# Patient Record
Sex: Female | Born: 1975
Health system: Southern US, Community
[De-identification: ages and names within clinical notes are randomized; demographics above are authoritative.]

## PROBLEM LIST (undated history)

## (undated) DIAGNOSIS — G35 Multiple sclerosis: Secondary | ICD-10-CM

## (undated) DIAGNOSIS — G709 Myoneural disorder, unspecified: Secondary | ICD-10-CM

## (undated) HISTORY — PX: TUBAL LIGATION: SHX77

## (undated) HISTORY — DX: Multiple sclerosis: G35

---

## 2005-01-22 ENCOUNTER — Emergency Department (HOSPITAL_COMMUNITY): Admission: EM | Admit: 2005-01-22 | Discharge: 2005-01-22 | Payer: Self-pay | Admitting: Family Medicine

## 2005-02-26 ENCOUNTER — Emergency Department (HOSPITAL_COMMUNITY): Admission: EM | Admit: 2005-02-26 | Discharge: 2005-02-26 | Payer: Self-pay | Admitting: Emergency Medicine

## 2005-03-22 ENCOUNTER — Emergency Department (HOSPITAL_COMMUNITY): Admission: EM | Admit: 2005-03-22 | Discharge: 2005-03-22 | Payer: Self-pay | Admitting: Emergency Medicine

## 2005-04-15 ENCOUNTER — Emergency Department (HOSPITAL_COMMUNITY): Admission: EM | Admit: 2005-04-15 | Discharge: 2005-04-15 | Payer: Self-pay | Admitting: Emergency Medicine

## 2006-05-05 ENCOUNTER — Ambulatory Visit (HOSPITAL_COMMUNITY): Admission: RE | Admit: 2006-05-05 | Discharge: 2006-05-05 | Payer: Self-pay | Admitting: Obstetrics & Gynecology

## 2006-09-01 ENCOUNTER — Inpatient Hospital Stay (HOSPITAL_COMMUNITY): Admission: AD | Admit: 2006-09-01 | Discharge: 2006-09-01 | Payer: Self-pay | Admitting: Family Medicine

## 2006-09-01 ENCOUNTER — Ambulatory Visit: Payer: Self-pay | Admitting: Obstetrics and Gynecology

## 2006-10-01 ENCOUNTER — Encounter (INDEPENDENT_AMBULATORY_CARE_PROVIDER_SITE_OTHER): Payer: Self-pay | Admitting: Gynecology

## 2006-10-01 ENCOUNTER — Inpatient Hospital Stay (HOSPITAL_COMMUNITY): Admission: AD | Admit: 2006-10-01 | Discharge: 2006-10-03 | Payer: Self-pay | Admitting: Family Medicine

## 2006-10-01 ENCOUNTER — Ambulatory Visit: Payer: Self-pay | Admitting: Gynecology

## 2008-05-31 ENCOUNTER — Emergency Department (HOSPITAL_COMMUNITY): Admission: EM | Admit: 2008-05-31 | Discharge: 2008-05-31 | Payer: Self-pay | Admitting: Emergency Medicine

## 2008-10-30 ENCOUNTER — Emergency Department (HOSPITAL_COMMUNITY): Admission: EM | Admit: 2008-10-30 | Discharge: 2008-10-30 | Payer: Self-pay | Admitting: Emergency Medicine

## 2010-08-03 NOTE — Op Note (Signed)
Jeanette Estrada, DATE              ACCOUNT NO.:  [id number]   MEDICAL RECORD NO.:  [id number]          PATIENT TYPE:  INP   LOCATION:  9101                          FACILITY:  [facility]   PHYSICIAN:  Ginger Carne, MD  DATE OF BIRTH:  1975-12-29   DATE OF PROCEDURE:  10/01/2006  DATE OF DISCHARGE:                               OPERATIVE REPORT   PREOPERATIVE DIAGNOSIS:  Postpartum sterilization.   POSTOPERATIVE DIAGNOSIS:  Postpartum sterilization.   PROCEDURE:  [place] bilateral tubal ligation.   SURGEON:  Ginger Carne, M.D.   ASSISTANT:  None.   COMPLICATIONS:  None immediate.   ESTIMATED BLOOD LOSS:  Minimal.   SPECIMEN:  Portions of right and left tubes to pathology.   COMPLICATIONS:  None immediate.   OPERATIVE FINDINGS:  Both tubes were identified from their isthmus to  their fimbriated ends separate apart from their respective round  ligaments. Uterus, tubes and ovaries showed normal decidual changes of  pregnancy.   OPERATIVE PROCEDURE:  The patient prepped and draped in usual fashion  and placed in lithotomy position.  Betadine solution used for antiseptic  and the patient was catheterized prior to procedure.  After adequate  epidural top off, a small vertical infraumbilical incision was made.  The abdomen opened.  Both tubes were grasped with the isthmus ampullary  junction.  Again they were identified separate apart from their  respective round ligaments. 3 cm of tube were incorporated and affixed 2-  0 plain catgut sutures utilized twice. Above the knots tubes were cut,  sent separately to pathology.  The tips were cauterized and no active  bleeding noted from either side. Afterwards of closure of the fascia in  one layer with 0 Vicryl running suture and 2-0 Monocryl for subcuticular  closure.  Instrument and sponge count were correct.  The patient  tolerated the procedure well, returned post anesthesia recovery room in  excellent condition.      Ginger Carne, MD  Electronically Signed     SHB/MEDQ  D:  10/01/2006  T:  10/01/2006  Job:  [id number]

## 2010-09-25 IMAGING — CR DG WRIST COMPLETE 3+V*R*
6 series · 6 of 6 positions shown · non-contrast
Comparison: None

CLINICAL DATA: Fall.

RIGHT WRIST - COMPLETE 3+ VIEW

[x wrist pa left]
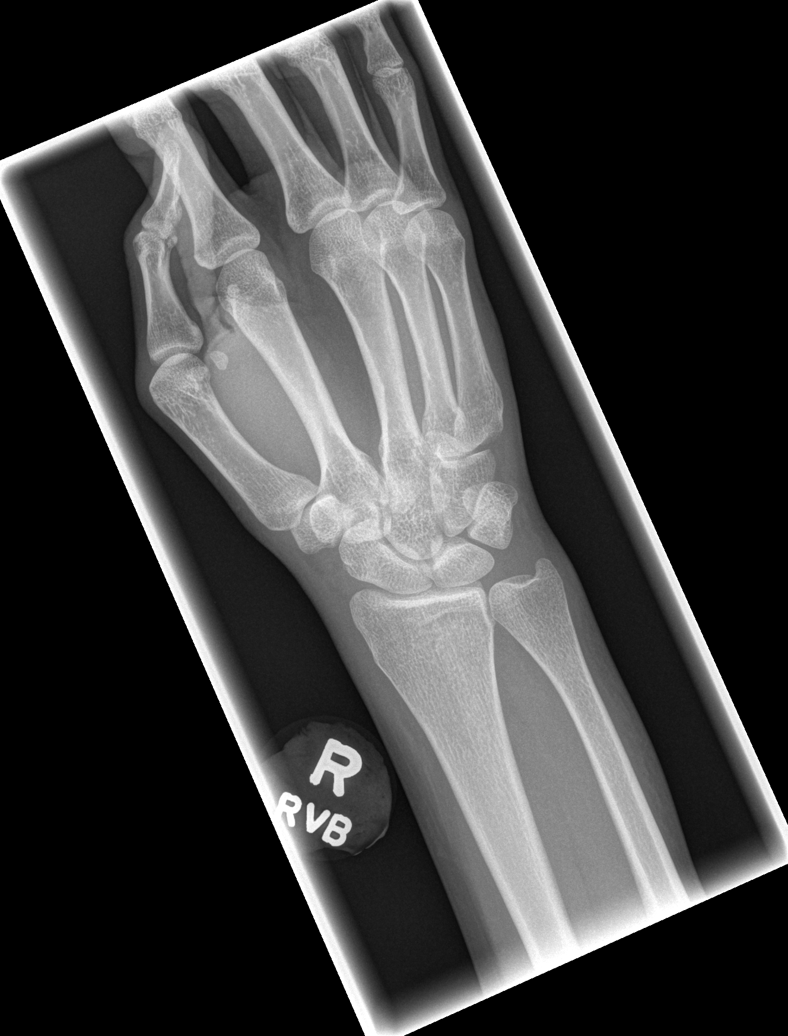

[x wrist obl left]
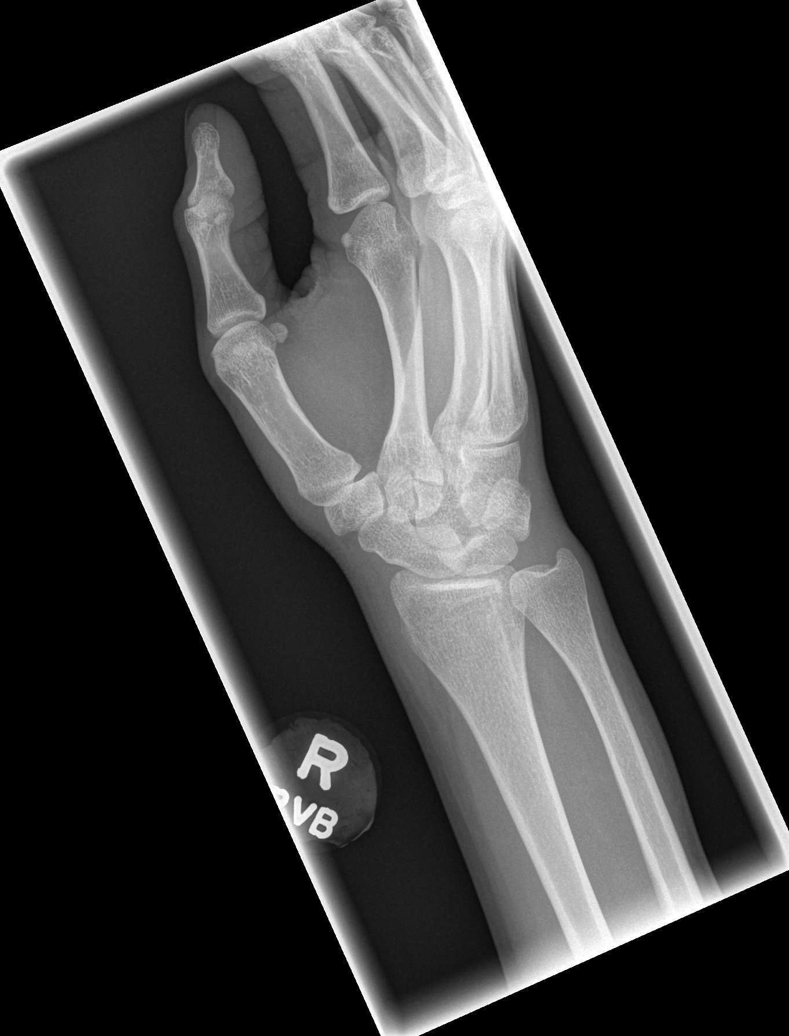

[x wrist lat left]
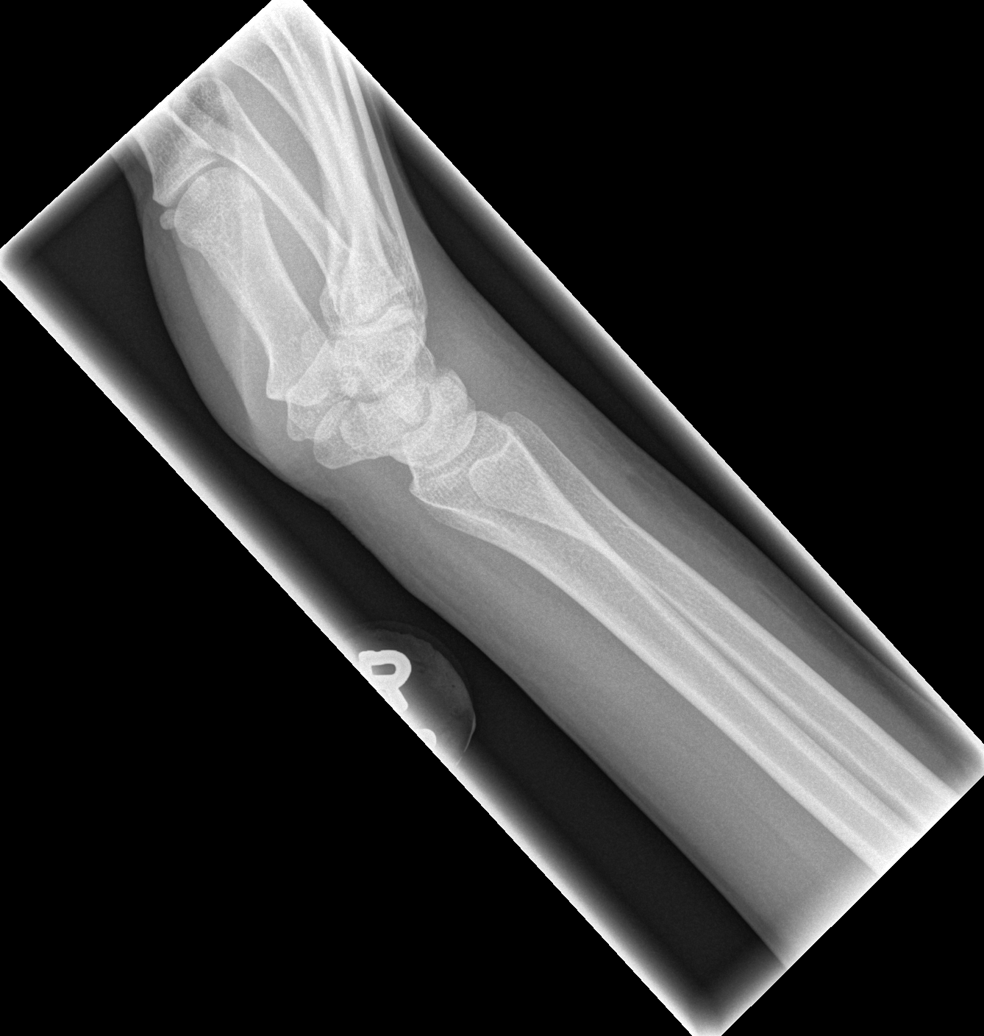

[x wrist navicular (1 of 3)]
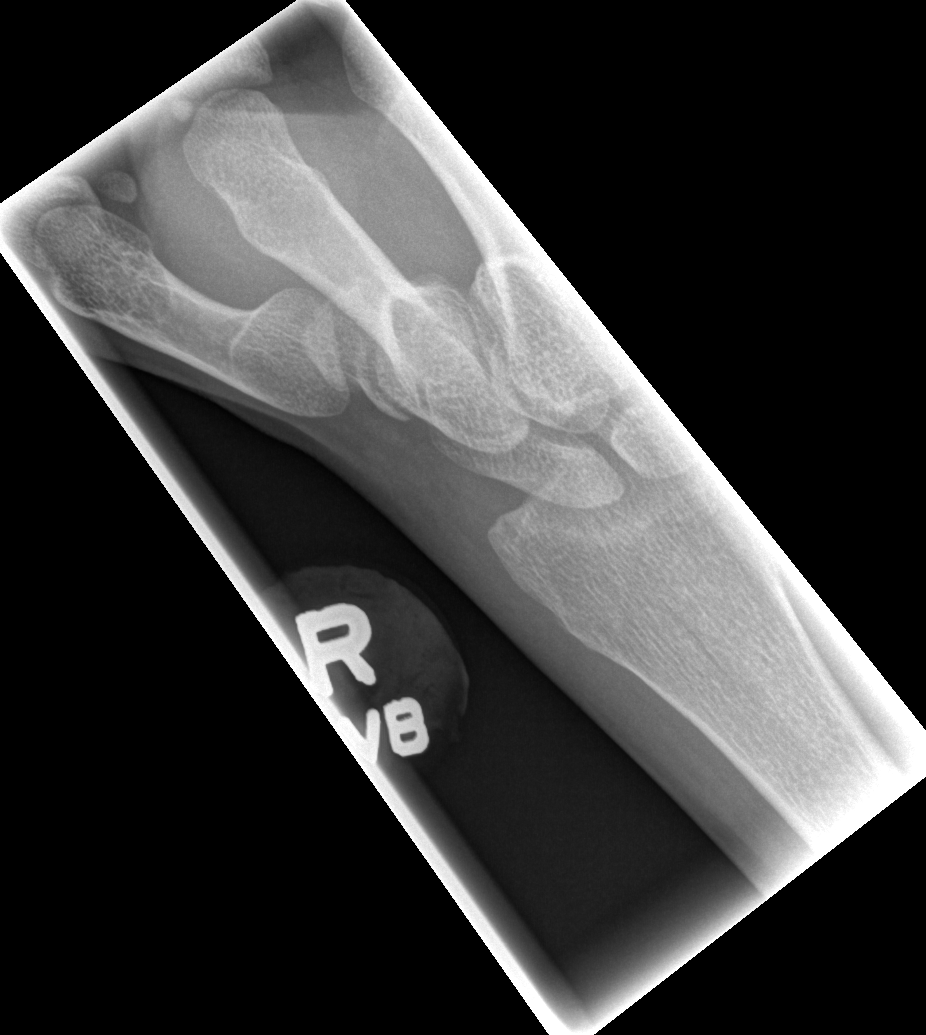

[x wrist navicular (2 of 3)]
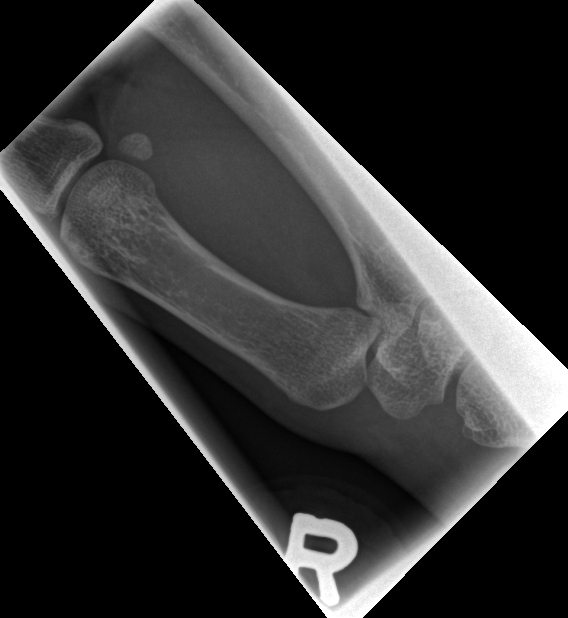

[x wrist navicular (3 of 3)]
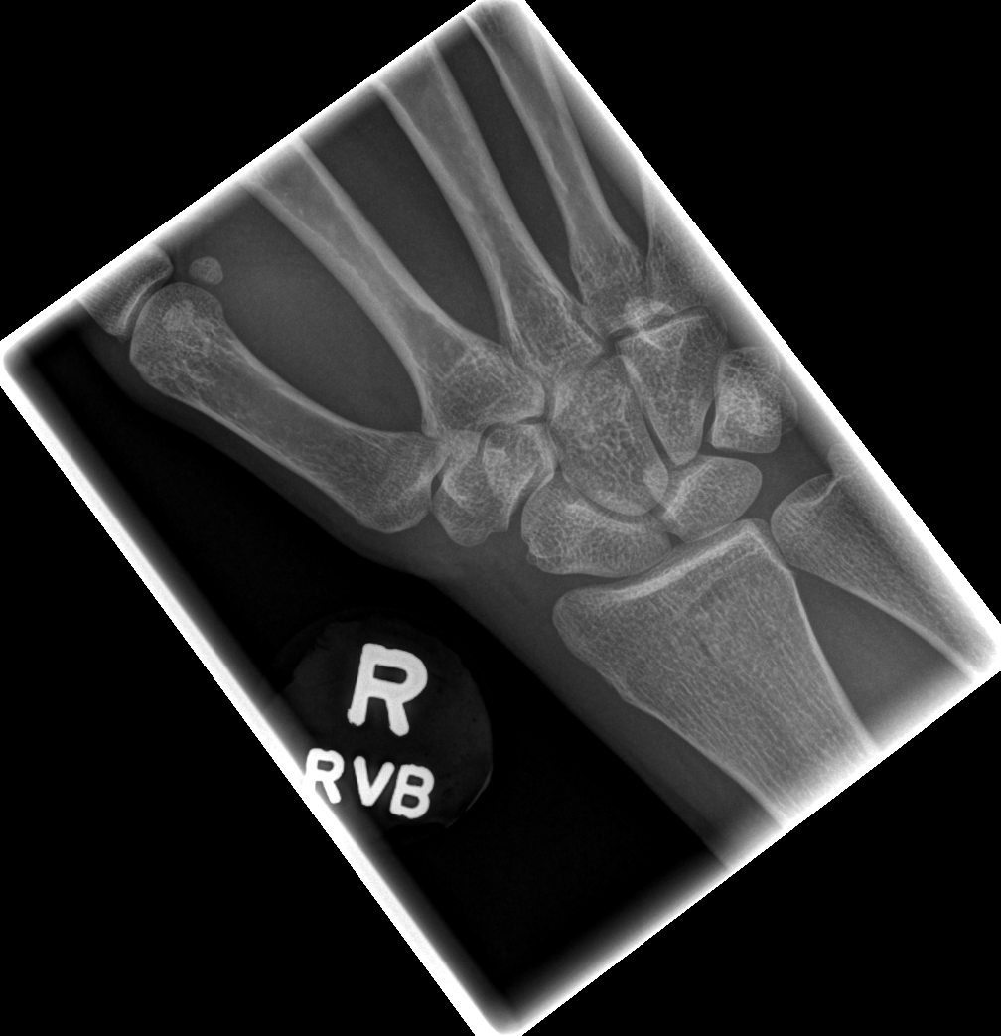

[6 of 6 positions shown; findings below may reference images not displayed]

FINDINGS: No acute fracture.  No dislocation.  Unremarkable soft
tissues.
IMPRESSION: No acute bony injury.

## 2010-12-31 LAB — RH IMMUNE GLOB WKUP(>/=20WKS)(NOT WOMEN'S HOSP): Fetal Screen: NEGATIVE

## 2010-12-31 LAB — CBC
HCT: 38.6
HCT: 39.4
Hemoglobin: 12.8
Hemoglobin: 13
MCHC: 33
MCHC: 33.2
MCHC: 33.2
MCV: 92
MCV: 92.6
RBC: 4.11
RBC: 4.19
WBC: 13.8 — ABNORMAL HIGH
WBC: 14.8 — ABNORMAL HIGH

## 2010-12-31 LAB — RPR: RPR Ser Ql: NONREACTIVE

## 2011-01-25 ENCOUNTER — Encounter: Payer: Self-pay | Admitting: *Deleted

## 2011-01-25 ENCOUNTER — Emergency Department (HOSPITAL_COMMUNITY): Payer: Self-pay

## 2011-01-25 ENCOUNTER — Emergency Department (HOSPITAL_COMMUNITY)
Admission: EM | Admit: 2011-01-25 | Discharge: 2011-01-26 | Disposition: A | Discharge status: Home / Self Care | Payer: Self-pay | Attending: Emergency Medicine | Admitting: Emergency Medicine

## 2011-01-25 DIAGNOSIS — R51 Headache: Secondary | ICD-10-CM | POA: Insufficient documentation

## 2011-01-25 DIAGNOSIS — R5381 Other malaise: Secondary | ICD-10-CM | POA: Insufficient documentation

## 2011-01-25 DIAGNOSIS — R32 Unspecified urinary incontinence: Secondary | ICD-10-CM | POA: Insufficient documentation

## 2011-01-25 DIAGNOSIS — R531 Weakness: Secondary | ICD-10-CM

## 2011-01-25 DIAGNOSIS — W19XXXA Unspecified fall, initial encounter: Secondary | ICD-10-CM | POA: Insufficient documentation

## 2011-01-25 DIAGNOSIS — R279 Unspecified lack of coordination: Secondary | ICD-10-CM | POA: Insufficient documentation

## 2011-01-25 DIAGNOSIS — R5383 Other fatigue: Secondary | ICD-10-CM | POA: Insufficient documentation

## 2011-01-25 DIAGNOSIS — R42 Dizziness and giddiness: Secondary | ICD-10-CM | POA: Insufficient documentation

## 2011-01-25 LAB — URINALYSIS, ROUTINE W REFLEX MICROSCOPIC
Glucose, UA: NEGATIVE mg/dL
Ketones, ur: NEGATIVE mg/dL
Nitrite: NEGATIVE
Protein, ur: 30 mg/dL — AB
Specific Gravity, Urine: 1.023 (ref 1.005–1.030)
Urobilinogen, UA: 1 mg/dL (ref 0.0–1.0)
pH: 7.5 (ref 5.0–8.0)

## 2011-01-25 LAB — RAPID URINE DRUG SCREEN, HOSP PERFORMED
Benzodiazepines: NOT DETECTED
Opiates: NOT DETECTED

## 2011-01-25 LAB — URINE MICROSCOPIC-ADD ON

## 2011-01-25 LAB — PREGNANCY, URINE: Preg Test, Ur: NEGATIVE

## 2011-01-25 NOTE — ED Notes (Signed)
Pt can not obtain urine at this time.....she wet the bed.Jeanette KitchenMarland KitchenMarland KitchenMarland Kitcheni told pt to let me or the  nurse know when she has to go to bathroom.

## 2011-01-25 NOTE — ED Notes (Signed)
Assumed care of patient, introduced self to patient. Patient states that she "wet the bed"  Patient states that she has been having problems holding her urine and is incontinent often.  Linens changed and new gown provided.  Patient aware still of need for urine sample.  Patient reports that she called the EMS for dizziness which has been going on for months, states that she is unsteady on her feet and very weak, also with numbness and tingling to arms and legs.  Patient reports no diagnosed medical conditions, no allergies, no prescribed medications but takes vitamins and OTC pain medication.  Patient reports headache at present as well.

## 2011-01-25 NOTE — ED Provider Notes (Signed)
History     CSN: [id number] Arrival date & time: 01/25/2011  7:46 PM   First MD Initiated Contact with Patient 01/25/11 2023      Chief Complaint  Patient presents with  . Weakness    (Consider location/radiation/quality/duration/timing/severity/associated sxs/prior treatment) Patient is a 35 y.o. female presenting with weakness. The history is provided by the patient.  Weakness The primary symptoms include headaches, dizziness and focal weakness. Primary symptoms do not include syncope, loss of consciousness, visual change, paresthesias or fever. Primary symptoms comment: Weaknesses periodic and causes falls. Episode onset: She has had similar symptoms for at least 12 years. The symptoms are worsening. The neurological symptoms are focal (Left side of her body gets weaker).  Headache is a chronic problem. The headache is associated with weakness and loss of balance. The headache is not associated with visual change or paresthesias.  Dizziness also occurs with weakness.  Additional symptoms include weakness and loss of balance. Additional symptoms do not include pain, lower back pain or leg pain. Associated symptoms comments: She has had urinary incontinence. Medical issues do not include seizures or cerebral vascular accident. Workup history includes MRI. Procedure history comments: She was told she might have MS.    History reviewed. No pertinent past medical history.  History reviewed. No pertinent past surgical history.  No family history on file.  History  Substance Use Topics  . Smoking status: Never Smoker   . Smokeless tobacco: Not on file  . Alcohol Use:     OB History    Grav Para Term Preterm Abortions TAB SAB Ect Mult Living                  Review of Systems  Constitutional: Negative for fever.  Cardiovascular: Negative for syncope.  Genitourinary:       Urinary incontinence twice a week, associated with urgency  Neurological: Positive for dizziness, focal  weakness, weakness, headaches and loss of balance. Negative for loss of consciousness and paresthesias.  All other systems reviewed and are negative.    Allergies  Review of patient's allergies indicates no known allergies.  Home Medications   Current Outpatient Rx  Name Route Sig Dispense Refill  . VITAMIN B-12 PO Oral Take 1 tablet by mouth daily.      . OMEGA-3 FATTY ACIDS 1000 MG PO CAPS Oral Take 1 g by mouth daily.      [name] MUCINEX PO Oral Take 1 tablet by mouth daily as needed. For congestion     . IBUPROFEN 200 MG PO TABS Oral Take 400 mg by mouth daily as needed. For pain     . OVER THE COUNTER MEDICATION Oral Take 2 capsules by mouth daily as needed. OTC tension headache medicine, take as needed for migraine     . HYDROCODONE-ACETAMINOPHEN 5-325 MG PO TABS Oral Take 2 tablets by mouth every 4 (four) hours as needed for pain. 20 tablet 0    BP 91/63  Pulse 71  Temp(Src) 99.1 F (37.3 C) (Oral)  Resp 20  SpO2 96%  Physical Exam  Constitutional: She is oriented to person, place, and time. She appears well-developed and well-nourished.  HENT:  Head: Normocephalic and atraumatic.  Eyes: Conjunctivae and EOM are normal. Pupils are equal, round, and reactive to light.  Neck: Normal range of motion. Neck supple.  Cardiovascular: Normal rate, regular rhythm and normal heart sounds.   Pulmonary/Chest: Effort normal and breath sounds normal.  Abdominal: Soft. Bowel sounds are normal.  Musculoskeletal:  Normal range of motion. She exhibits no edema and no tenderness.  Neurological: She is alert and oriented to person, place, and time. No cranial nerve deficit. She exhibits normal muscle tone. Coordination normal.  Skin: Skin is warm and dry.  Psychiatric: She has a normal mood and affect. Her behavior is normal. Judgment and thought content normal.    ED Course  Procedures (including critical care time) OTHER DATA REVIEWED: Nursing notes, vital signs, and past medical records  reviewed.    DIAGNOSTIC STUDIES: Oxygen Saturation is 96% on room air, normal by my interpretation.    LABS / RADIOLOGY: Possible urinary tract infection, culture ordered.  PROCEDURES:    ED COURSE / COORDINATION OF CARE:   MDM: Evaluation most consistent with indolent multiple sclerosis. This likely explains recurrent falls and urinary incontinence. There is no apparent urinary tract infection. At this time. Patient is stable for discharge with outpatient evaluation and ultimate diagnosis to be made by neurology.  IMPRESSION:  PLAN:  Home  The patient is to return the emergency department if there is any worsening of symptoms. I have reviewed the discharge instructions with the patient and husband  CONDITION ON DISCHARGE: Good  MEDICATIONS GIVEN IN THE E.D.  Medications  Cyanocobalamin (VITAMIN B-12 PO) (not administered)  fish oil-omega-3 fatty acids 1000 MG capsule (not administered)  OVER THE COUNTER MEDICATION (not administered)  ibuprofen (ADVIL,MOTRIN) 200 MG tablet (not administered)  GuaiFENesin (MUCINEX PO) (not administered)  HYDROcodone-acetaminophen (NORCO) 5-325 MG per tablet (not administered)    DISCHARGE MEDICATIONS: New Prescriptions   HYDROCODONE-ACETAMINOPHEN (NORCO) 5-325 MG PER TABLET    Take 2 tablets by mouth every 4 (four) hours as needed for pain.      Labs Reviewed  URINALYSIS, ROUTINE W REFLEX MICROSCOPIC - Abnormal; Notable for the following:    Appearance CLOUDY (*)    Protein, ur 30 (*)    All other components within normal limits  URINE RAPID DRUG SCREEN (HOSP PERFORMED) - Abnormal; Notable for the following:    Tetrahydrocannabinol POSITIVE (*)    All other components within normal limits  URINE MICROSCOPIC-ADD ON - Abnormal; Notable for the following:    Squamous Epithelial / LPF FEW (*)    All other components within normal limits  PREGNANCY, URINE  POCT PREGNANCY, URINE  URINE CULTURE     1. Fall   2. Weakness               Flint Melter, MD 01/26/11 [phone]

## 2011-01-25 NOTE — ED Notes (Signed)
Per EMS:  Pt c/o weakness progressing over past couple months.  Family described "jerking on the left side" today, none witnessed by EMS.  Passed the stroke screen for EMS.  Told she had "leisions on her brain" in 2001.

## 2011-01-26 MED ORDER — HYDROCODONE-ACETAMINOPHEN 5-325 MG PO TABS: 2.0000 | ORAL_TABLET | ORAL | Status: AC | PRN

## 2011-01-26 NOTE — ED Notes (Signed)
Patient being discharged. IV d/c catheter intact, dressing applied. D/C Instructions explained to patient, verbalizes understanding, no further questions. Patient provided with prescriptions for Norco Patient escorted to discharge window with all personal belongings.

## 2011-01-27 LAB — URINE CULTURE: Culture: NO GROWTH

## 2011-08-11 ENCOUNTER — Encounter (HOSPITAL_COMMUNITY): Payer: Self-pay | Admitting: *Deleted

## 2011-08-11 ENCOUNTER — Inpatient Hospital Stay (HOSPITAL_COMMUNITY)
Admission: EM | Admit: 2011-08-11 | Discharge: 2011-08-14 | DRG: 059 | Disposition: A | Discharge status: Rehabilitation Facility | Payer: Medicaid Other | Attending: Internal Medicine | Admitting: Internal Medicine

## 2011-08-11 ENCOUNTER — Emergency Department (HOSPITAL_COMMUNITY): Payer: Medicaid Other

## 2011-08-11 DIAGNOSIS — R4182 Altered mental status, unspecified: Secondary | ICD-10-CM

## 2011-08-11 DIAGNOSIS — E7220 Disorder of urea cycle metabolism, unspecified: Secondary | ICD-10-CM | POA: Diagnosis present

## 2011-08-11 DIAGNOSIS — I959 Hypotension, unspecified: Secondary | ICD-10-CM | POA: Diagnosis present

## 2011-08-11 DIAGNOSIS — G35 Multiple sclerosis: Principal | ICD-10-CM | POA: Diagnosis present

## 2011-08-11 DIAGNOSIS — G709 Myoneural disorder, unspecified: Secondary | ICD-10-CM | POA: Diagnosis present

## 2011-08-11 HISTORY — DX: Myoneural disorder, unspecified: G70.9

## 2011-08-11 LAB — CBC
Hemoglobin: 13.7 g/dL (ref 12.0–15.0)
RDW: 14 % (ref 11.5–15.5)
WBC: 12.7 10*3/uL — ABNORMAL HIGH (ref 4.0–10.5)

## 2011-08-11 LAB — COMPREHENSIVE METABOLIC PANEL
ALT: 16 U/L (ref 0–35)
Albumin: 4.9 g/dL (ref 3.5–5.2)
GFR calc Af Amer: >90 mL/min (ref >90)
Potassium: 3.9 mEq/L (ref 3.5–5.1)
Total Bilirubin: 0.3 mg/dL (ref 0.3–1.2)
Total Protein: 8.2 g/dL (ref 6.0–8.3)

## 2011-08-11 LAB — DIFFERENTIAL
Basophils Absolute: 0 10*3/uL (ref 0.0–0.1)
Basophils Relative: 0 % (ref 0–1)
Lymphocytes Relative: 33 % (ref 12–46)
Monocytes Absolute: 0.6 10*3/uL (ref 0.1–1.0)
Monocytes Relative: 4 % (ref 3–12)
Neutro Abs: 7.7 10*3/uL (ref 1.7–7.7)

## 2011-08-11 LAB — AMMONIA: Ammonia: 71 umol/L — ABNORMAL HIGH (ref 11–60)

## 2011-08-11 LAB — ETHANOL: Alcohol, Ethyl (B): <11 mg/dL (ref 0–11)

## 2011-08-11 MED ORDER — SODIUM CHLORIDE 0.9 % IV BOLUS (SEPSIS)
1000.0000 mL | Freq: Once | INTRAVENOUS | Status: AC
  Administered 2011-08-11: 1000 mL via INTRAVENOUS

## 2011-08-11 NOTE — ED Provider Notes (Signed)
History     CSN: [id number]  Arrival date & time 08/11/11  2107   First MD Initiated Contact with Patient 08/11/11 2152      Chief Complaint  Patient presents with  . Tremors    (Consider location/radiation/quality/duration/timing/severity/associated sxs/prior treatment) HPI Comments: Pt with 2 years of progressively worsening loss of balance, intermittent weakness which has been worsening.  Pt now confused today and did not recognize her children earlier today so family brought to ED.  Pt has had several ED visits previously with head CT concerning for MS.  Has been referred to neuro several times but has not been able to f/u due to lack of insurance.  Family concerned today because she has gotten so much worse recently.  Patient is a 36 y.o. female presenting with neurologic complaint. The history is provided by the EMS personnel.  Neurologic Problem The primary symptoms include altered mental status, focal weakness (legs) and speech change. Primary symptoms do not include visual change, fever or vomiting. Episode onset: 2 years. The episode lasted 800 days. The symptoms are worsening. The neurological symptoms are diffuse. Context: no injury.  Additional symptoms include weakness (legs) and loss of balance.    History reviewed. No pertinent past medical history.  History reviewed. No pertinent past surgical history.  No family history on file.  History  Substance Use Topics  . Smoking status: Never Smoker   . Smokeless tobacco: Not on file  . Alcohol Use: No    OB History    Grav Para Term Preterm Abortions TAB SAB Ect Mult Living                  Review of Systems  Constitutional: Negative for fever and activity change.  HENT: Negative for congestion.   Eyes: Negative for visual disturbance.  Respiratory: Negative for chest tightness and shortness of breath.   Cardiovascular: Negative for chest pain and leg swelling.  Gastrointestinal: Negative for vomiting and  abdominal pain.  Genitourinary: Negative for dysuria.  Skin: Negative for rash.  Neurological: Positive for speech change, focal weakness (legs), weakness (legs) and loss of balance. Negative for syncope.  Psychiatric/Behavioral: Positive for altered mental status. Negative for behavioral problems.    Allergies  Review of patient's allergies indicates no known allergies.  Home Medications  No current outpatient prescriptions on file.  BP 93/51  Pulse 75  Temp(Src) 98.7 F (37.1 C) (Oral)  Resp 18  SpO2 100%  Physical Exam  Constitutional: She appears well-developed and well-nourished.  HENT:  Head: Normocephalic and atraumatic.  Eyes: Conjunctivae and EOM are normal. Pupils are equal, round, and reactive to light. No scleral icterus.  Neck: Normal range of motion. Neck supple.  Cardiovascular: Normal rate and regular rhythm.  Exam reveals no gallop and no friction rub.   No murmur heard. Pulmonary/Chest: Effort normal and breath sounds normal. No respiratory distress. She has no wheezes. She has no rales. She exhibits no tenderness.  Abdominal: Soft. She exhibits no distension and no mass. There is no tenderness. There is no rebound and no guarding.  Musculoskeletal: Normal range of motion.  Neurological: She is alert. She exhibits normal muscle tone.       Difficulty following commands.  Slurred speech and not answering questions appropriately.  Good tone in all extremities but unable to hold legs off of bed to gravity.  Skin: Skin is warm and dry. No rash noted.  Psychiatric: She has a normal mood and affect. Her behavior is normal.  Judgment and thought content normal.    ED Course  Procedures (including critical care time)  Labs Reviewed  CBC - Abnormal; Notable for the following:    WBC 12.7 (*)    All other components within normal limits  DIFFERENTIAL - Abnormal; Notable for the following:    Lymphs Abs 4.2 (*)    All other components within normal limits  AMMONIA  - Abnormal; Notable for the following:    Ammonia 71 (*)    All other components within normal limits  COMPREHENSIVE METABOLIC PANEL  ETHANOL  URINALYSIS, ROUTINE W REFLEX MICROSCOPIC  PREGNANCY, URINE  URINE RAPID DRUG SCREEN (HOSP PERFORMED)   Ct Head Wo Contrast  08/11/2011  *RADIOLOGY REPORT*  Clinical Data: Tremors with lethargy  CT HEAD WITHOUT CONTRAST  Technique:  Contiguous axial images were obtained from the base of the skull through the vertex without contrast.  Comparison: 01/25/2011.  Findings: There is no evidence for acute infarction, intracranial hemorrhage, mass lesion, hydrocephalus, or extra-axial fluid. Premature atrophy, cerebellar greater than cerebral, is accompanied by numerous periventricular white matter lesions suggesting multiple sclerosis. A similar appearance was seen previously.  Calvarium is intact.  Clear sinuses and mastoids.  IMPRESSION: Premature atrophy along with multiple suspected white matter lesions is consistent with multiple sclerosis.  There is no significant progression from previous CT head.  No visible acute stroke or bleed.  Original Report Authenticated By: Elsie Stain, M.D.     1. Altered mental status       MDM  Pt with 2 years of progressively worsening loss of balance, intermittent weakness which has been worsening.  Pt now confused today and did not recognize her children earlier today so family brought to ED.  Pt has had several ED visits previously with head CT concerning for MS.  Has been referred to neuro several times but has not been able to f/u due to lack of insurance.  Family concerned today because she has gotten so much worse recently.  VSS.  Difficult neuro exam 2/2 poor cooperation but pt clearly altered with coordination difficulty on exam.  Had prolonged discussion with family.  Pt unable to be cared for at home.  Head CT today concerning for MS.  Labs show elevated ammonia - etiology of which is unclear and otherwise  unconcerning.  D/w neuro - will consult on pt in the am.  Hospitalist to admit for MRI and further management.        Army Chaco, MD 08/11/11 2325

## 2011-08-11 NOTE — ED Notes (Signed)
I gave the patient a warm blanket. 

## 2011-08-11 NOTE — ED Notes (Signed)
Patient is unable to void at present. 

## 2011-08-11 NOTE — ED Notes (Signed)
Tremors for one month and today she has not been able to remember even the names of her children.  Pt not answering questions.

## 2011-08-12 ENCOUNTER — Inpatient Hospital Stay (HOSPITAL_COMMUNITY): Payer: Medicaid Other

## 2011-08-12 ENCOUNTER — Encounter (HOSPITAL_COMMUNITY): Payer: Self-pay | Admitting: Family Medicine

## 2011-08-12 DIAGNOSIS — G709 Myoneural disorder, unspecified: Secondary | ICD-10-CM | POA: Diagnosis present

## 2011-08-12 DIAGNOSIS — G35D Multiple sclerosis, unspecified: Secondary | ICD-10-CM

## 2011-08-12 DIAGNOSIS — G35 Multiple sclerosis: Secondary | ICD-10-CM

## 2011-08-12 LAB — URINALYSIS, ROUTINE W REFLEX MICROSCOPIC
Glucose, UA: NEGATIVE mg/dL
Hgb urine dipstick: NEGATIVE
Leukocytes, UA: NEGATIVE
Protein, ur: NEGATIVE mg/dL
Specific Gravity, Urine: 1.024 (ref 1.005–1.030)
Urobilinogen, UA: 1 mg/dL (ref 0.0–1.0)

## 2011-08-12 LAB — CBC
Hemoglobin: 10.9 g/dL — ABNORMAL LOW (ref 12.0–15.0)
MCHC: 34.1 g/dL (ref 30.0–36.0)
Platelets: 212 10*3/uL (ref 150–400)
RBC: 3.64 MIL/uL — ABNORMAL LOW (ref 3.87–5.11)

## 2011-08-12 LAB — COMPREHENSIVE METABOLIC PANEL
ALT: 10 U/L (ref 0–35)
AST: 14 U/L (ref 0–37)
Alkaline Phosphatase: 59 U/L (ref 39–117)
GFR calc Af Amer: >90 mL/min (ref >90)
Glucose, Bld: 117 mg/dL — ABNORMAL HIGH (ref 70–99)
Potassium: 3.8 mEq/L (ref 3.5–5.1)
Sodium: 139 mEq/L (ref 135–145)
Total Protein: 5.7 g/dL — ABNORMAL LOW (ref 6.0–8.3)

## 2011-08-12 LAB — RAPID URINE DRUG SCREEN, HOSP PERFORMED
Amphetamines: NOT DETECTED
Opiates: NOT DETECTED
Tetrahydrocannabinol: POSITIVE — AB

## 2011-08-12 LAB — URINE MICROSCOPIC-ADD ON

## 2011-08-12 MED ORDER — ACETAMINOPHEN 650 MG RE SUPP: 650.0000 mg | Freq: Four times a day (QID) | RECTAL | Status: DC | PRN

## 2011-08-12 MED ORDER — SODIUM CHLORIDE 0.9 % IV SOLN: 1000.0000 mg | INTRAVENOUS | Status: DC

## 2011-08-12 MED ORDER — SODIUM CHLORIDE 0.9 % IV SOLN
1000.0000 mg | Freq: Once | INTRAVENOUS | Status: AC
  Administered 2011-08-12: 1000 mg via INTRAVENOUS
  Filled 2011-08-12 (×2): qty 8

## 2011-08-12 MED ORDER — ACETAMINOPHEN 325 MG PO TABS
650.0000 mg | ORAL_TABLET | Freq: Four times a day (QID) | ORAL | Status: DC | PRN
  Administered 2011-08-12 – 2011-08-13 (×3): 650 mg via ORAL
  Filled 2011-08-12 (×3): qty 2

## 2011-08-12 MED ORDER — ENOXAPARIN SODIUM 40 MG/0.4ML ~~LOC~~ SOLN
40.0000 mg | SUBCUTANEOUS | Status: DC
  Administered 2011-08-12 – 2011-08-14 (×3): 40 mg via SUBCUTANEOUS
  Filled 2011-08-12 (×4): qty 0.4

## 2011-08-12 MED ORDER — SODIUM CHLORIDE 0.9 % IV SOLN
1000.0000 mg | INTRAVENOUS | Status: DC
  Administered 2011-08-13 – 2011-08-14 (×2): 1000 mg via INTRAVENOUS
  Filled 2011-08-12 (×4): qty 8

## 2011-08-12 MED ORDER — ONDANSETRON HCL 4 MG PO TABS: 4.0000 mg | ORAL_TABLET | Freq: Four times a day (QID) | ORAL | Status: DC | PRN

## 2011-08-12 MED ORDER — POTASSIUM CHLORIDE IN NACL 20-0.9 MEQ/L-% IV SOLN
INTRAVENOUS | Status: DC
  Administered 2011-08-12 – 2011-08-13 (×3): via INTRAVENOUS
  Filled 2011-08-12 (×6): qty 1000

## 2011-08-12 MED ORDER — SODIUM CHLORIDE 0.9 % IV BOLUS (SEPSIS)
500.0000 mL | Freq: Once | INTRAVENOUS | Status: AC
  Administered 2011-08-12: 500 mL via INTRAVENOUS

## 2011-08-12 MED ORDER — ONDANSETRON HCL 4 MG/2ML IJ SOLN: 4.0000 mg | Freq: Four times a day (QID) | INTRAMUSCULAR | Status: DC | PRN

## 2011-08-12 MED ORDER — GADOBENATE DIMEGLUMINE 529 MG/ML IV SOLN
13.0000 mL | Freq: Once | INTRAVENOUS | Status: AC
  Administered 2011-08-12: 13 mL via INTRAVENOUS

## 2011-08-12 NOTE — Plan of Care (Signed)
Problem: Phase I Progression Outcomes Goal: Initial discharge plan identified Outcome: Not Met (add Reason) Unsure per pt and family

## 2011-08-12 NOTE — Consult Note (Signed)
[facility] CONSULT NOTE     Reason for Consult: weakness, possible multiple sclerosis CC: weakness and shakiness   HPI:    Jeanette Estrada is an 36 y.o. female who tells me along with family that she has had progressive weakness in her extremities over the past few years with noted "trembling" all over as well. She states that she can barely walk at times, using a cane. In speaking with her she is slow to respond to questions, but answers them appropriately. Her family states that she occasionally has slurred speech when she gets upset and she does have some short term memory loss that is intermittent.   Just after college about 10 years ago she was told that she had a mini stroke and had slurred speech at that time. No further treatment per patient.  In addition she has had worsening of gait over years and at one time did fall resulting in wrist fx.   In 01/2011 she was seen at [facility] for similar sx and CT scanned did show small foci of low attenuation in the periventricular white suspicious for multiple sclerosis or other demyelinating process. She says that she had no outpatient f/u arranged with neurologist. Next the she then went to ER at [facility] and that "they couldn't figure it out" and did not have f/u arranged.   Due to her progressive sx, she then returned to [facility] for re-evaluation due to the above mentioned symptoms   Past Medical History  Diagnosis Date  . Neuromuscular disease     Past Surgical History  Procedure Date  . Tubal ligation     Family History  Problem Relation Age of Onset  . Multiple sclerosis Maternal Grandmother     Social History:  3ppd smoker, etoh, no illicit drugs.  No Known Allergies  Medications:    Prior to Admission:  No prescriptions prior to admission   Scheduled:   . enoxaparin  40 mg Subcutaneous Q24H  . methylPREDNISolone (SOLU-MEDROL) injection  1,000 mg Intravenous Q24H  . methylPREDNISolone  (SOLU-MEDROL) injection  1,000 mg Intravenous Once  . sodium chloride  1,000 mL Intravenous Once  . sodium chloride  500 mL Intravenous Once    Review of Systems - General ROS: negative for - chills, fatigue, fever or hot flashes Hematological and Lymphatic ROS: negative for - bruising, fatigue, jaundice or pallor Endocrine ROS: negative for - hair pattern changes, hot flashes, mood swings or skin changes Respiratory ROS: negative for - cough, hemoptysis, orthopnea or wheezing Cardiovascular ROS: negative for - dyspnea on exertion, orthopnea, palpitations or shortness of breath Gastrointestinal ROS: negative for - abdominal pain, appetite loss, blood in stools, diarrhea or hematemesis Musculoskeletal ROS: negative for - joint pain, joint stiffness, joint swelling or muscle pain, but + muscle weakness Neurological ROS: positive for - bowel and bladder control changes, confusion, gait disturbance, impaired coordination/balance, memory loss, numbness/tingling, speech problems, tremors and weakness Dermatological ROS: negative for dry skin, pruritus and rash   Blood pressure 97/66, pulse 80, temperature 98.2 F (36.8 C), temperature source Oral, resp. rate 18, height 5\' 9"  (1.753 m), weight 63.7 kg (140 lb 6.9 oz), SpO2 99.00%.   Neurologic Examination:   Mental Status: Alert, oriented, thought content appropriate; she is slow to respond to questions but has appropriate answers.  Speech fluent without evidence of aphasia. Able to follow 3 step commands without  difficulty. Cranial Nerves: II-Visual fields grossly intact. III/IV/VI-Extraocular movements intact.  Pupils reactive bilaterally. V/VII-Smile symmetric.  Decreased pinprick and light touch on the left with some evidence of splitting the midline. VIII-grossly intact IX/X-normal gag XI-bilateral shoulder shrug XII-midline tongue extension Motor: 5-/5 in the bilateral upper extremities with a 3/5 hand grip bilaterally.  4/5 in the  right lower extremity, 3/5 in the left lower extremity with increased tone on the right. Bradykinetic Sensory: Pinprick and light touch decreased on the left Deep Tendon Reflexes: 3+ throughout with 2+ ankle jerks bilaterally, Plantars mute.  Cerebellar: Normal finger-to-nose and normal heel-to-shin test.       Results for orders placed during the hospital encounter of 08/11/11 (from the past 48 hour(s))  CBC     Status: Abnormal   Collection Time   08/11/11 10:00 PM      Component Value Range Comment   WBC 12.7 (*) 4.0 - 10.5 (K/uL)    RBC 4.68  3.87 - 5.11 (MIL/uL)    Hemoglobin 13.7  12.0 - 15.0 (g/dL)    HCT 16.1  09.6 - 04.5 (%)    MCV 89.5  78.0 - 100.0 (fL)    MCH 29.3  26.0 - 34.0 (pg)    MCHC 32.7  30.0 - 36.0 (g/dL)    RDW 40.9  81.1 - 91.4 (%)    Platelets 243  150 - 400 (K/uL)   DIFFERENTIAL     Status: Abnormal   Collection Time   08/11/11 10:00 PM      Component Value Range Comment   Neutrophils Relative 61  43 - 77 (%)    Neutro Abs 7.7  1.7 - 7.7 (K/uL)    Lymphocytes Relative 33  12 - 46 (%)    Lymphs Abs 4.2 (*) 0.7 - 4.0 (K/uL)    Monocytes Relative 4  3 - 12 (%)    Monocytes Absolute 0.6  0.1 - 1.0 (K/uL)    Eosinophils Relative 1  0 - 5 (%)    Eosinophils Absolute 0.1  0.0 - 0.7 (K/uL)    Basophils Relative 0  0 - 1 (%)    Basophils Absolute 0.0  0.0 - 0.1 (K/uL)   COMPREHENSIVE METABOLIC PANEL     Status: Normal   Collection Time   08/11/11 10:00 PM      Component Value Range Comment   Sodium 140  135 - 145 (mEq/L)    Potassium 3.9  3.5 - 5.1 (mEq/L)    Chloride 103  96 - 112 (mEq/L)    CO2 24  19 - 32 (mEq/L)    Glucose, Bld 76  70 - 99 (mg/dL)    BUN 14  6 - 23 (mg/dL)    Creatinine, Ser 7.82  0.50 - 1.10 (mg/dL)    Calcium 9.9  8.4 - 10.5 (mg/dL)    Total Protein 8.2  6.0 - 8.3 (g/dL)    Albumin 4.9  3.5 - 5.2 (g/dL)    AST 20  0 - 37 (U/L)    ALT 16  0 - 35 (U/L)    Alkaline Phosphatase 85  39 - 117 (U/L)    Total Bilirubin 0.3  0.3 - 1.2  (mg/dL)    GFR calc non Af Amer >90  >90 (mL/min)    GFR calc Af Amer >90  >90 (mL/min)   ETHANOL     Status: Normal   Collection Time   08/11/11 10:00 PM      Component Value Range  Comment   Alcohol, Ethyl (B) <11  0 - 11 (mg/dL)   AMMONIA     Status: Abnormal   Collection Time   08/11/11 10:01 PM      Component Value Range Comment   Ammonia 71 (*) 11 - 60 (umol/L)   CBC     Status: Abnormal   Collection Time   08/12/11  3:30 AM      Component Value Range Comment   WBC 7.9  4.0 - 10.5 (K/uL)    RBC 3.64 (*) 3.87 - 5.11 (MIL/uL)    Hemoglobin 10.9 (*) 12.0 - 15.0 (g/dL)    HCT 16.1 (*) 09.6 - 46.0 (%)    MCV 87.9  78.0 - 100.0 (fL)    MCH 29.9  26.0 - 34.0 (pg)    MCHC 34.1  30.0 - 36.0 (g/dL)    RDW 04.5  40.9 - 81.1 (%)    Platelets 212  150 - 400 (K/uL)   COMPREHENSIVE METABOLIC PANEL     Status: Abnormal   Collection Time   08/12/11  3:30 AM      Component Value Range Comment   Sodium 139  135 - 145 (mEq/L)    Potassium 3.8  3.5 - 5.1 (mEq/L)    Chloride 109  96 - 112 (mEq/L)    CO2 23  19 - 32 (mEq/L)    Glucose, Bld 117 (*) 70 - 99 (mg/dL)    BUN 12  6 - 23 (mg/dL)    Creatinine, Ser 9.14  0.50 - 1.10 (mg/dL)    Calcium 8.3 (*) 8.4 - 10.5 (mg/dL)    Total Protein 5.7 (*) 6.0 - 8.3 (g/dL)    Albumin 3.2 (*) 3.5 - 5.2 (g/dL)    AST 14  0 - 37 (U/L)    ALT 10  0 - 35 (U/L)    Alkaline Phosphatase 59  39 - 117 (U/L)    Total Bilirubin 0.4  0.3 - 1.2 (mg/dL)    GFR calc non Af Amer >90  >90 (mL/min)    GFR calc Af Amer >90  >90 (mL/min)   PREGNANCY, URINE     Status: Normal   Collection Time   08/12/11  7:24 AM      Component Value Range Comment   Preg Test, Ur NEGATIVE  NEGATIVE    URINE RAPID DRUG SCREEN (HOSP PERFORMED)     Status: Abnormal   Collection Time   08/12/11  7:24 AM      Component Value Range Comment   Opiates NONE DETECTED  NONE DETECTED     Cocaine NONE DETECTED  NONE DETECTED     Benzodiazepines NONE DETECTED  NONE DETECTED     Amphetamines NONE  DETECTED  NONE DETECTED     Tetrahydrocannabinol POSITIVE (*) NONE DETECTED     Barbiturates NONE DETECTED  NONE DETECTED    URINALYSIS, ROUTINE W REFLEX MICROSCOPIC     Status: Abnormal   Collection Time   08/12/11  7:24 AM      Component Value Range Comment   Color, Urine YELLOW  YELLOW     APPearance TURBID (*) CLEAR     Specific Gravity, Urine 1.024  1.005 - 1.030     pH 8.0  5.0 - 8.0     Glucose, UA NEGATIVE  NEGATIVE (mg/dL)    Hgb urine dipstick NEGATIVE  NEGATIVE     Bilirubin Urine NEGATIVE  NEGATIVE     Ketones, ur NEGATIVE  NEGATIVE (mg/dL)  Protein, ur NEGATIVE  NEGATIVE (mg/dL)    Urobilinogen, UA 1.0  0.0 - 1.0 (mg/dL)    Nitrite NEGATIVE  NEGATIVE     Leukocytes, UA NEGATIVE  NEGATIVE    URINE MICROSCOPIC-ADD ON     Status: Normal   Collection Time   08/12/11  7:24 AM      Component Value Range Comment   Squamous Epithelial / LPF RARE  RARE     WBC, UA 0-2  <3 (WBC/hpf)    RBC / HPF 0-2  <3 (RBC/hpf)    Bacteria, UA RARE  RARE     Urine-Other AMORPHOUS URATES/PHOSPHATES       Dg Chest 1 View  08/12/2011  *RADIOLOGY REPORT*  Clinical Data: Tremors with memory loss.  Leukocytosis.  CHEST - 1 VIEW  Comparison: None.  Findings: Normal heart size with clear lung fields.  No bony abnormality.  IMPRESSION: Negative exam.  Original Report Authenticated By: Elsie Stain, M.D.   Ct Head Wo Contrast  08/11/2011  *RADIOLOGY REPORT*  Clinical Data: Tremors with lethargy  CT HEAD WITHOUT CONTRAST  Technique:  Contiguous axial images were obtained from the base of the skull through the vertex without contrast.  Comparison: 01/25/2011.  Findings: There is no evidence for acute infarction, intracranial hemorrhage, mass lesion, hydrocephalus, or extra-axial fluid. Premature atrophy, cerebellar greater than cerebral, is accompanied by numerous periventricular white matter lesions suggesting multiple sclerosis. A similar appearance was seen previously.  Calvarium is intact.  Clear  sinuses and mastoids.  IMPRESSION: Premature atrophy along with multiple suspected white matter lesions is consistent with multiple sclerosis.  There is no significant progression from previous CT head.  No visible acute stroke or bleed.  Original Report Authenticated By: Elsie Stain, M.D.     Assessment/Plan:    36 yo female with abnormal CT head concerning for multiple sclerosis consistent with exam.  Has not had treatment and/or adequate work up in the past.    Plan:  1.  MRI of the brain with/without contrast.  2.  Agree with Solumedrol at 1000mg  daily with 5 days anticipated.   3.  Will need SW intervention 4.  GI prophylaxis 5.  PT/OT consults  Gwendolyn Lima. Manson Passey, [facility] [phone]  08/12/2011, 8:49 AM   Patient seen and examined. I agree with the above.  Thana Farr, MD [facility] [phone]  08/12/2011  1:32 PM

## 2011-08-12 NOTE — Evaluation (Signed)
Physical Therapy Evaluation Patient Details Name: Jeanette Estrada MRN: [id number] DOB: Oct 12, 1975 Today's Date: 08/12/2011 Time: 4098-1191 PT Time Calculation (min): 42 min  PT Assessment / Plan / Recommendation Clinical Impression  patient admitted with long history of neuromuscular disorder.  Workup indicates possible MS (new diagnosis for patient).  patient with increased tremors with mobility limiting safety with mobility.  Feel patient would benefit from continue PT to increase strength and mobility.  Feel patient would benefit from CIR to maximize potential as patient needs to be fairly indepedent to care for children and for in-depth patient education with new diagnosis.                            PT Assessment  Patient needs continued PT services    Follow Up Recommendations  Inpatient Rehab    Barriers to Discharge Decreased caregiver support      lEquipment Recommendations  Defer to next venue    Recommendations for Other Services Rehab consult   Frequency Min 4X/week    Precautions / Restrictions Precautions Precautions: Fall   Pertinent Vitals/Pain No pain      Mobility  Bed Mobility Bed Mobility: Supine to Sit Supine to Sit: 6: Modified independent (Device/Increase time) Details for Bed Mobility Assistance: increased time with movements Transfers Transfers: Sit to Stand;Stand to Sit Sit to Stand: 4: Min assist;With upper extremity assist;From bed Stand to Sit: 4: Min assist;With upper extremity assist;To bed Details for Transfer Assistance: patient with extra time for movements; patient with increased tremors during and once reached standing.  Required assistance for safety with tremors/balance. Ambulation/Gait Ambulation/Gait Assistance: 4: Min assist Ambulation Distance (Feet): 80 Feet Assistive device: Rolling walker Ambulation/Gait Assistance Details: patient tremulous throughout gait requiring assistance for safety.  No obvious loss of balance, however,  concerned about tremors throwing off balance.   Gait Pattern: Step-to pattern;Decreased dorsiflexion - left;Decreased dorsiflexion - right;Right steppage;Left steppage;Ataxic (tremulous throughout mobility) Stairs: No    Exercises     PT Diagnosis: Difficulty walking  PT Problem List: Decreased strength;Decreased activity tolerance;Decreased balance;Decreased mobility;Decreased coordination;Decreased knowledge of use of DME;Impaired tone PT Treatment Interventions: DME instruction;Gait training;Stair training;Functional mobility training;Therapeutic activities;Therapeutic exercise;Balance training;Neuromuscular re-education;Patient/family education   PT Goals Acute Rehab PT Goals PT Goal Formulation: With patient Time For Goal Achievement: 08/26/11 Potential to Achieve Goals: Good Pt will go Sit to Stand: with modified independence;without upper extremity assist PT Goal: Sit to Stand - Progress: Goal set today Pt will go Stand to Sit: with modified independence;without upper extremity assist PT Goal: Stand to Sit - Progress: Goal set today Pt will Stand: with modified independence;with no upper extremity support PT Goal: Stand - Progress: Goal set today Pt will Ambulate: >150 feet;with modified independence;with least restrictive assistive device;with gait velocity >(comment) ft/second (gait velocity at 2.62 ft/sec) PT Goal: Ambulate - Progress: Goal set today Pt will Go Up / Down Stairs: Flight;with modified independence;with rail(s);with least restrictive assistive device PT Goal: Up/Down Stairs - Progress: Goal set today Pt will Perform Home Exercise Program: Independently PT Goal: Perform Home Exercise Program - Progress: Goal set today  Visit Information  Last PT Received On: 08/12/11 Assistance Needed: +1    Subjective Data  Subjective: patient reports she is determined to get stronger and take care of her children Patient Stated Goal: Take care of my kids   Prior  Functioning  Home Living Lives With: Family (lives with sister and her children) Available Help at Discharge:  Family;Available PRN/intermittently Type of Home: House Home Layout: Two level (pt lives in basement; kitchen upstairs) Alternate Level Stairs-Number of Steps: flight Alternate Level Stairs-Rails: Right Home Adaptive Equipment: Straight cane Prior Function Level of Independence: Needs assistance Needs Assistance: Gait Gait Assistance: needed assistance to ambulate Driving: Yes Communication Communication: Expressive difficulties (slow speech)    Cognition  Overall Cognitive Status: Appears within functional limits for tasks assessed/performed Arousal/Alertness: Awake/alert Orientation Level: Appears intact for tasks assessed Behavior During Session: [facility] for tasks performed    Extremity/Trunk Assessment Right Lower Extremity Assessment RLE ROM/Strength/Tone: Deficits RLE ROM/Strength/Tone Deficits: grossly 3/5 for knee flexion/extenstion and hip flexion.  3-/5 for ankle dorsiflexion RLE Coordination: Deficits RLE Coordination Deficits: slightly ataxic with movements Left Lower Extremity Assessment LLE ROM/Strength/Tone: Deficits LLE ROM/Strength/Tone Deficits: grossly 3/5 for knee flexion/extenstion and hip flexion.  3-/5 for ankle dorsiflexion LLE Coordination: Deficits LLE Coordination Deficits: slightly ataxic with movements Trunk Assessment Trunk Assessment: Normal   Balance Balance Balance Assessed: Yes Static Sitting Balance Static Sitting - Balance Support: No upper extremity supported Static Sitting - Level of Assistance: 6: Modified independent (Device/Increase time) Static Sitting - Comment/# of Minutes: at least 2 minutes  End of Session PT - End of Session Equipment Utilized During Treatment: Gait belt Activity Tolerance: Patient tolerated treatment well Patient left: in bed (OT in room) Nurse Communication: Mobility status   Olivia Canter,  [place] [phone] 08/12/2011, 2:59 PM

## 2011-08-12 NOTE — ED Provider Notes (Signed)
I reviewed the resident's note and I agree with the findings and plan.     Nelia Shi, MD 08/12/11 [phone]

## 2011-08-12 NOTE — Progress Notes (Signed)
Occupational Therapy Evaluation Patient Details Name: Jeanette Estrada MRN: [id number] DOB: 1975-12-23 Today's Date: 08/12/2011 Time: 4098-1191 OT Time Calculation (min): 33 min  OT Assessment / Plan / Recommendation Clinical Impression  Pt with history of neuromuscular disease and workup being completed for possible new diagnosis of MS.  Will benefit from acute OT to address below problem list in prep for d/c to CIR to further maximize independence.    OT Assessment  Patient needs continued OT Services    Follow Up Recommendations  Inpatient Rehab    Barriers to Discharge      Equipment Recommendations  Defer to next venue    Recommendations for Other Services Rehab consult  Frequency  Min 2X/week    Precautions / Restrictions Precautions Precautions: Fall   Pertinent Vitals/Pain See vitals    ADL  Eating/Feeding: Performed;Modified independent Where Assessed - Eating/Feeding: Edge of bed Grooming: Performed;Applying makeup;Set up (setup to retrieve grooming items) Where Assessed - Grooming: Unsupported sitting Upper Body Bathing: Simulated;Set up Where Assessed - Upper Body Bathing: Unsupported sitting Lower Body Bathing: Simulated;Minimal assistance Where Assessed - Lower Body Bathing: Supported sit to stand Upper Body Dressing: Simulated;Set up Where Assessed - Upper Body Dressing: Unsupported sitting Lower Body Dressing: Performed;Minimal assistance (min assist for balance and steadying) Where Assessed - Lower Body Dressing: Sopported sit to stand Toilet Transfer: Performed;Minimal assistance Toilet Transfer Method:  (ambulating) Toilet Transfer Equipment: Raised toilet seat with arms (or 3-in-1 over toilet) Equipment Used: Rolling walker;Gait belt Transfers/Ambulation Related to ADLs: Pt ambulated with min assist with RW for balance as she has significant tremors during ambulation. No LOB. ADL Comments: Min assist for dynamic standing balance tasks.    OT  Diagnosis: Generalized weakness  OT Problem List: Decreased strength;Impaired balance (sitting and/or standing);Decreased coordination;Decreased knowledge of use of DME or AE OT Treatment Interventions: Self-care/ADL training;Neuromuscular education;DME and/or AE instruction;Energy conservation;Therapeutic activities;Patient/family education;Balance training   OT Goals Acute Rehab OT Goals OT Goal Formulation: With patient Time For Goal Achievement: 08/19/11 Potential to Achieve Goals: Good ADL Goals Pt Will Perform Grooming: with modified independence;Standing at sink;Sitting at sink ADL Goal: Grooming - Progress: Goal set today Miscellaneous OT Goals Miscellaneous OT Goal #1: Pt will perform 3/3 toileting tasks with mod I. OT Goal: Miscellaneous Goal #1 - Progress: Goal set today Miscellaneous OT Goal #2: Pt will be able to retrieve ADL items with mod I with LRD. OT Goal: Miscellaneous Goal #2 - Progress: Goal set today Miscellaneous OT Goal #3: Pt will be able to verbalize and incorporate energy conservation techniques during 75% of ADL activity. 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 Patient Stated Goal: To be able to take care of kids.   Prior Functioning  Home Living Lives With: Family (lives with sister and her children) Available Help at Discharge: Family;Available PRN/intermittently Type of Home: House Home Layout: Two level (pt lives in basement; kitchen upstairs) Alternate Level Stairs-Number of Steps: flight Alternate Level Stairs-Rails: Right Bathroom Shower/Tub: Tub/shower unit;Curtain Bathroom Toilet: Standard Home Adaptive Equipment: Straight cane;Hand-held shower hose Prior Function Level of Independence: Needs assistance Needs Assistance: Gait Gait Assistance: needed assistance to  ambulate Driving: Yes Comments: Pt reports she has has many falls at home and out in the community.  She frequently uses motorized scooters at stores. Communication Communication: Expressive difficulties (slow  speech) Dominant Hand: Left    Cognition  Overall Cognitive Status: Appears within functional limits for tasks assessed/performed Arousal/Alertness: Awake/alert Orientation Level: Appears intact for tasks assessed Behavior During Session: [facility] for tasks performed    Extremity/Trunk Assessment Right Upper Extremity Assessment RUE ROM/Strength/Tone: Deficits RUE ROM/Strength/Tone Deficits: 3+/5 throughout.   RUE Sensation: Deficits RUE Sensation Deficits: When asked to perform certain movements with UE, pt required increased time and reports that she sometimes needs to look at her arm to be sure of where it is.  RUE Coordination: Deficits RUE Coordination Deficits: Requires increased time.  Slightly tremorous during gross/fine motor activites (opening containers, applying makeup).  Left Upper Extremity Assessment LUE ROM/Strength/Tone: Deficits LUE ROM/Strength/Tone Deficits: 3+/5 throughout LUE Sensation: Deficits LUE Sensation Deficits: When asked to perform certain movements with UE, pt required increased time and reports that she sometimes needs to look at her arm to be sure of where it is.  LUE Coordination: Deficits LUE Coordination Deficits: Requires increased time.  Slightly tremorous during gross/fine motor activites (opening containers, applying makeup).  Right Lower Extremity Assessment RLE ROM/Strength/Tone: Deficits RLE ROM/Strength/Tone Deficits: grossly 3/5 for knee flexion/extenstion and hip flexion.  3-/5 for ankle dorsiflexion RLE Coordination: Deficits RLE Coordination Deficits: slightly ataxic with movements Left Lower Extremity Assessment LLE ROM/Strength/Tone: Deficits LLE ROM/Strength/Tone Deficits: grossly 3/5 for knee flexion/extenstion and hip flexion.   3-/5 for ankle dorsiflexion LLE Coordination: Deficits LLE Coordination Deficits: slightly ataxic with movements Trunk Assessment Trunk Assessment: Normal   Mobility Bed Mobility Bed Mobility: Supine to Sit Supine to Sit: 6: Modified independent (Device/Increase time) Details for Bed Mobility Assistance: increased time with movements Transfers Sit to Stand: 4: Min assist;With upper extremity assist;From bed Stand to Sit: 4: Min assist;With upper extremity assist;To bed Details for Transfer Assistance: Required increased time to obtain upright position.  Increase in tremors once fully upright. Assist for steadying and balance.   Exercise    Balance Balance Balance Assessed: Yes Static Sitting Balance Static Sitting - Balance Support: No upper extremity supported Static Sitting - Level of Assistance: 6: Modified independent (Device/Increase time) Static Sitting - Comment/# of Minutes: 15 min  End of Session OT - End of Session Equipment Utilized During Treatment: Gait belt Activity Tolerance: Patient tolerated treatment well Patient left: in bed;with call bell/phone within reach;with bed alarm set Nurse Communication: Mobility status  08/12/2011 Cipriano Mile OTR/L Pager [phone] Office [phone]  Cipriano Mile 08/12/2011, 5:16 PM

## 2011-08-12 NOTE — Progress Notes (Signed)
Patient [name]  female  ZOX:[id number]    DOB: 05-13-1975    DOA: 08/11/2011  PCP: Erlinda Hong, MD, MD  Subjective: Feels some strength in legs today  Objective: Weight change:  No intake or output data in the 24 hours ending 08/12/11 1212 Blood pressure 103/65, pulse 80, temperature 98.7 F (37.1 C), temperature source Oral, resp. rate 18, height 5\' 9"  (1.753 m), weight 63.7 kg (140 lb 6.9 oz), SpO2 98.00%.  Physical Exam: General: Alert and awake, oriented x3, not in any acute distress. HEENT: anicteric sclera, pupils reactive to light and accommodation, EOMI CVS: S1-S2 clear, no murmur rubs or gallops Chest: clear to auscultation bilaterally, no wheezing, rales or rhonchi Abdomen: soft nontender, nondistended, normal bowel sounds, no organomegaly Extremities: no cyanosis, clubbing or edema noted bilaterally   Lab Results: Basic Metabolic Panel:  Lab 08/12/11 8119 08/11/11 2200  NA 139 140  K 3.8 3.9  CL 109 103  CO2 23 24  GLUCOSE 117* 76  BUN 12 14  CREATININE 0.82 0.74  CALCIUM 8.3* 9.9  MG -- --  PHOS -- --   Liver Function Tests:  Lab 08/12/11 0330 08/11/11 2200  AST 14 20  ALT 10 16  ALKPHOS 59 85  BILITOT 0.4 0.3  PROT 5.7* 8.2  ALBUMIN 3.2* 4.9    Lab 08/11/11 2201  AMMONIA 71*   CBC:  Lab 08/12/11 0330 08/11/11 2200  WBC 7.9 12.7*  NEUTROABS -- 7.7  HGB 10.9* 13.7  HCT 32.0* 41.9  MCV 87.9 89.5  PLT 212 243    Studies/Results: Dg Chest 1 View  08/12/2011  *RADIOLOGY REPORT*  Clinical Data: Tremors with memory loss.  Leukocytosis.  CHEST - 1 VIEW  Comparison: None.  Findings: Normal heart size with clear lung fields.  No bony abnormality.  IMPRESSION: Negative exam.  Original Report Authenticated By: Elsie Stain, M.D.   Ct Head Wo Contrast  08/11/2011    IMPRESSION: Premature atrophy along with multiple suspected white matter lesions is consistent with multiple sclerosis.  There is no significant progression from previous CT  head.  No visible acute stroke or bleed.  Original Report Authenticated By: Elsie Stain, M.D.    Medications: Scheduled Meds:   . enoxaparin  40 mg Subcutaneous Q24H  . methylPREDNISolone (SOLU-MEDROL) injection  1,000 mg Intravenous Once  . methylPREDNISolone (SOLU-MEDROL) injection  1,000 mg Intravenous Q24H  . sodium chloride  1,000 mL Intravenous Once  . sodium chloride  500 mL Intravenous Once  . DISCONTD: methylPREDNISolone (SOLU-MEDROL) injection  1,000 mg Intravenous Q24H   Continuous Infusions:   . 0.9 % NaCl with KCl 20 mEq / L 75 mL/hr at 08/12/11 0341     Assessment/Plan: Principal Problem:   Neuromuscular disease concerning for multiple sclerosis - MRI brain and cervical spine pending - Continue high-dose IV Solu-Medrol, neurology consultation - PTOT evaluation - Will need social work assistance for [employer] and outpatient management  Drug use: UDS positive for [facility] - Social worker consult for substance abuse  Hyper ammoniemia: NH3 71 - No mental status changes, will recheck  DVT Prophylaxis: SCDs  Code Status: Full code  Disposition: Not medically ready   LOS: 1 day   [name] [facility] 08/12/2011, 12:12 PM Pager: [phone]

## 2011-08-12 NOTE — Clinical Social Work Psychosocial (Signed)
     Clinical Social Work Department BRIEF PSYCHOSOCIAL ASSESSMENT 08/12/2011  Patient:  Jeanette Estrada, Jeanette Estrada     Account Number:  [id number]     Admit date:  08/11/2011  Clinical Social Worker:  Peggyann Shoals  Date/Time:  08/12/2011 11:40 AM  Referred by:  Physician  Date Referred:  08/12/2011 Referred for  Other - See comment   Other Referral:   Pt is in need of medical follow up and medication coverage.   Interview type:  Patient Other interview type:    PSYCHOSOCIAL DATA Living Status:  FAMILY Admitted from facility:   Level of care:   Primary support name:  Jeanette Estrada Primary support relationship to patient:  SIBLING Degree of support available:   Adequate. Pt lives with 3 children, aunt, sister, sister's 3 children.    CURRENT CONCERNS Current Concerns  Financial Resources  Other - See comment   Other Concerns:   Pt referred for medical assistance.    SOCIAL WORK ASSESSMENT / PLAN CSW met with pt to address consult. CSW introduced herself and explained role of social work. Pt shared that she has been living in [place] for 6 years. When she started to get sick, her husband left and pt moved in with her sister with pt's 3 children. Pt shared that her sister and aunt are caring for pt's children during admission. Pt reported that she lacks insurance and is concerned how she is going to pay for her medications. Pt shared that she is on food stamps and accesses the food pantries in the area. Pt reported that her children have medicaid. Pt is interested in housing resources.    CSW informed pt that she had referred pt to [facility] for medical follow up and assistance with medication. CSW will also contact financial counselor regarding medicaid application and payment plan.    CSW will continue to follow to provide psychosocial support during this admission. Please call with any urgent needs. Pt plans to discharge home when medically ready.   Assessment/plan  status:  Psychosocial Support/Ongoing Assessment of Needs Other assessment/ plan:   Information/referral to community resources:   Housing resources  Outpatient therapy resources    PATIENTS/FAMILYS RESPONSE TO PLAN OF CARE: Pt was alert and oriented. Pt was very pleasant and thankful for CSW's intervention.

## 2011-08-12 NOTE — H&P (Signed)
PCP:   None   Chief Complaint:  Decreased communication  HPI: This is a very unfortunate case of a 36 year old female was being displaying increasing neuromuscular decline over the past 10 years without any medical intervention. Today she was less responsive, not talking, she had excessive tremulousness, unable to stand even with assistance. She complained of  tingling in the hands and toes and a headache. She had loss of balance. The family took her to the ER.  Normally the patient can walk with a cane and with family assistance. She began having tremors which has been increasing in severity over the past 2 months. She needs assistance with baths. She is able to feed herself fairly okay. She is slow to speech and has slurred speech when nervous or excited. She was walking fairly independently about a year ago.  Her symptoms started approximately 10 years ago with visual defects. Patient had been an athlete in high school, and started developing balance issues, initially thought to be vertigo. She would trip and fall with little provocation. She's been taken  to multiple hospitals but has never been hospitalized. She is a deceased grandmother with multiple sclerosis and has had CT head in the past suggestive of MS. The patient is uninsured and unable to see a neurologist as an outpatient. Patient was married and at that time did not qualify for Medicaid. She is now separated for several months and living with her sister. She has 3 children 10, 8 and 4. She has never had a workup or been on medications for multiple sclerosis.  History provided by patient's sister and brother. Patient is alert and oriented, she follows commands.  Review of Systems: Positives bolded  anorexia, fever, weight loss,, vision loss, decreased hearing, hoarseness, chest pain, syncope, dyspnea on exertion, peripheral edema, balance deficits, hemoptysis, abdominal pain, melena, hematochezia, severe indigestion/heartburn,  hematuria, incontinence, genital sores, muscle weakness, suspicious skin lesions, transient blindness, difficulty walking, depression, unusual weight change, abnormal bleeding, enlarged lymph nodes, angioedema, and breast masses.  Past Medical History: Past Medical History  Diagnosis Date  . Neuromuscular disease    History reviewed. No pertinent past surgical history.  Medications: Prior to Admission medications   Not on File  none  Allergies:  No Known Allergies  Social History:  reports that she has never smoked. She does not have any smokeless tobacco history on file. She reports that she does not drink alcohol or use illicit drugs.  Family History: Family History  Problem Relation Age of Onset  . Multiple sclerosis Maternal Grandmother     Physical Exam: Filed Vitals:   08/11/11 2300 08/11/11 2345 08/12/11 0030 08/12/11 0100  BP: 93/51 108/59 116/66 99/67  Pulse: 75 81 82 80  Temp:      TempSrc:      Resp:      SpO2: 100% 100% 100% 100%    General:  Alert and oriented times three, well developed and nourished, no acute distress, slow speech no slurring Eyes: PERRLA, pink conjunctiva, no scleral icterus ENT: Moist oral mucosa, neck supple, no thyromegaly Lungs: clear to ascultation, no wheeze, no crackles, no use of accessory muscles Cardiovascular: regular rate and rhythm, no regurgitation, no gallops, no murmurs. No carotid bruits, no JVD Abdomen: soft, positive BS, non-tender, non-distended, no organomegaly, not an acute abdomen GU: not examined Neuro/musculoskeletal: CN II - XII grossly intact,  Positive lateral and horizontal nystagmus, hyperreflexic spasticity throughout, decreased strength upper and lower extremities with no endurance stability, downward going Babinski,  no clubbing, cyanosis or edema Skin: no rash, no subcutaneous crepitation, no decubitus Psych: appropriate patient   Labs on Admission:   Basename 08/11/11 2200  NA 140  K 3.9  CL 103    CO2 24  GLUCOSE 76  BUN 14  CREATININE 0.74  CALCIUM 9.9  MG --  PHOS --    Basename 08/11/11 2200  AST 20  ALT 16  ALKPHOS 85  BILITOT 0.3  PROT 8.2  ALBUMIN 4.9   No results found for this basename: LIPASE:2,AMYLASE:2 in the last 72 hours  Basename 08/11/11 2200  WBC 12.7*  NEUTROABS 7.7  HGB 13.7  HCT 41.9  MCV 89.5  PLT 243   No results found for this basename: CKTOTAL:3,CKMB:3,CKMBINDEX:3,TROPONINI:3 in the last 72 hours No components found with this basename: POCBNP:3 No results found for this basename: DDIMER:2 in the last 72 hours No results found for this basename: HGBA1C:2 in the last 72 hours No results found for this basename: CHOL:2,HDL:2,LDLCALC:2,TRIG:2,CHOLHDL:2,LDLDIRECT:2 in the last 72 hours No results found for this basename: TSH,T4TOTAL,FREET3,T3FREE,THYROIDAB in the last 72 hours No results found for this basename: VITAMINB12:2,FOLATE:2,FERRITIN:2,TIBC:2,IRON:2,RETICCTPCT:2 in the last 72 hours  Micro Results: No results found for this or any previous visit (from the past 240 hour(s)).   Radiological Exams on Admission: Ct Head Wo Contrast  08/11/2011  *RADIOLOGY REPORT*  Clinical Data: Tremors with lethargy  CT HEAD WITHOUT CONTRAST  Technique:  Contiguous axial images were obtained from the base of the skull through the vertex without contrast.  Comparison: 01/25/2011.  Findings: There is no evidence for acute infarction, intracranial hemorrhage, mass lesion, hydrocephalus, or extra-axial fluid. Premature atrophy, cerebellar greater than cerebral, is accompanied by numerous periventricular white matter lesions suggesting multiple sclerosis. A similar appearance was seen previously.  Calvarium is intact.  Clear sinuses and mastoids.  IMPRESSION: Premature atrophy along with multiple suspected white matter lesions is consistent with multiple sclerosis.  There is no significant progression from previous CT head.  No visible acute stroke or bleed.   Original Report Authenticated By: Elsie Stain, M.D.    Assessment/Plan Present on Admission:  .Neuromuscular disease Admit to MedSurg Patient likely has multiple sclerosis MRI brain and cervical spine ordered with and without  Solu-Medrol 1 g IV for 3 days ordered Neurology consulted Dr. Thad Ranger to see patient in a.m. Rule out secondary causes with UA and chest x-ray given patient's leukocytosis Consult PT and OT Social issues Social service consulted for application of [employer]  mild hypotension Bolus with IV fluids   Full code DVT prophylaxis Team 8/Dr. Domingo Madeira, Mayra Brahm 08/12/2011, 1:27 AM

## 2011-08-13 DIAGNOSIS — G35 Multiple sclerosis: Secondary | ICD-10-CM

## 2011-08-13 DIAGNOSIS — R5381 Other malaise: Secondary | ICD-10-CM

## 2011-08-13 DIAGNOSIS — R5383 Other fatigue: Secondary | ICD-10-CM

## 2011-08-13 LAB — BASIC METABOLIC PANEL
BUN: 8 mg/dL (ref 6–23)
Chloride: 108 mEq/L (ref 96–112)
GFR calc Af Amer: >90 mL/min (ref >90)
GFR calc non Af Amer: >90 mL/min (ref >90)
Potassium: 3.8 mEq/L (ref 3.5–5.1)
Sodium: 140 mEq/L (ref 135–145)

## 2011-08-13 MED ORDER — PANTOPRAZOLE SODIUM 40 MG PO TBEC
40.0000 mg | DELAYED_RELEASE_TABLET | Freq: Every day | ORAL | Status: DC
  Administered 2011-08-13 – 2011-08-14 (×2): 40 mg via ORAL
  Filled 2011-08-13: qty 1

## 2011-08-13 NOTE — Progress Notes (Signed)
Physical Therapy Treatment Patient Details Name: Jeanette Estrada MRN: [id number] DOB: Nov 21, 1975 Today's Date: 08/13/2011 Time: 7829-5621 PT Time Calculation (min): 27 min  PT Assessment / Plan / Recommendation Comments on Treatment Session  pt presents with new dx of MS.  pt very motivated to Maximize I prior to D/C to home.  pt would benefit from CIR at D/C.      Follow Up Recommendations  Inpatient Rehab    Barriers to Discharge        Equipment Recommendations  Defer to next venue    Recommendations for Other Services Rehab consult  Frequency Min 4X/week   Plan Discharge plan remains appropriate;Frequency remains appropriate    Precautions / Restrictions Precautions Precautions: Fall Restrictions Weight Bearing Restrictions: No   Pertinent Vitals/Pain Denies pain.      Mobility  Bed Mobility Bed Mobility: Not assessed Transfers Transfers: Sit to Stand;Stand to Sit Sit to Stand: 4: Min assist;With upper extremity assist;From bed Stand to Sit: 4: Min assist;With upper extremity assist;To bed Details for Transfer Assistance: pt shaking and requires MinA for steadying.  Cues for use of UEs.   Ambulation/Gait Ambulation/Gait Assistance: 4: Min assist Ambulation Distance (Feet): 100 Feet Assistive device: Rolling walker Ambulation/Gait Assistance Details: pt with tremors throughout ambulation, at times needing steadying for balance.  pt's L LE externally rotates and L knee hyperextends.  pt notes this has been going on for a couple years as she ahs been getting weaker.   Gait Pattern: Step-through pattern;Decreased stride length;Decreased dorsiflexion - left;Decreased dorsiflexion - right (L LE externally rotated and knee hyperextends) Stairs: No Wheelchair Mobility Wheelchair Mobility: No    Exercises     PT Diagnosis:    PT Problem List:   PT Treatment Interventions:     PT Goals Acute Rehab PT Goals Time For Goal Achievement: 08/26/11 PT Goal: Sit to Stand -  Progress: Progressing toward goal PT Goal: Stand to Sit - Progress: Progressing toward goal PT Goal: Stand - Progress: Progressing toward goal PT Goal: Ambulate - Progress: Progressing toward goal  Visit Information  Last PT Received On: 08/13/11 Assistance Needed: +1 PT/OT Co-Evaluation/Treatment: Yes    Subjective Data  Subjective: pt notes she feels today is a good day.     Cognition  Overall Cognitive Status: Appears within functional limits for tasks assessed/performed Arousal/Alertness: Awake/alert Orientation Level: Appears intact for tasks assessed Behavior During Session: [facility] for tasks performed    Balance  Balance Balance Assessed: Yes Static Standing Balance Static Standing - Balance Support: No upper extremity supported;During functional activity Static Standing - Level of Assistance: 5: Stand by assistance (pt leans against sink for support.  ) Static Standing - Comment/# of Minutes: pt able to stand with hips leaned against sink and stand with Supervision for hand hygiene.  pt still tremors.    End of Session PT - End of Session Equipment Utilized During Treatment: Gait belt Activity Tolerance: Patient tolerated treatment well Patient left: in bed;with call bell/phone within reach;with bed alarm set (Sitting EOB talking with MD.  ) Nurse Communication: Mobility status    Rubel Heckard, Alison Murray, [place] [phone] 08/13/2011, 12:44 PM

## 2011-08-13 NOTE — Consult Note (Signed)
Physical Medicine and Rehabilitation Consult Reason for Consult: Suspect multiple sclerosis Referring Phsyician: Jeanette Estrada is an 36 y.o. female.   HPI: 36 year old right-handed female admitted 08/12/2011 with progressive weakness in her extremities of the past few years with no trembling. She states she can barely walk at times and uses a cane. MRI of the brain showed widespread chronic appearing white matter disease with a pattern typical of chronic multiple sclerosis. MRI cervical spine with numerous areas of abnormal T2 signal within the cervical spinal cord consistent with multiple sclerosis involvement of the cervical spine. Neurology consulted placed on high dose intravenous Solu-Medrol. Urine drug screen on admission was positive for marijuana. Subcutaneous Lovenox added for DVT prophylaxis. Physical and occupational therapy ongoing with recommendations for physical medicine rehabilitation consult to consider inpatient rehabilitation services  Review of Systems  Eyes: Positive for double vision.  Neurological: Positive for tingling and weakness.  All other systems reviewed and are negative.   Past Medical History  Diagnosis Date  . Neuromuscular disease    Past Surgical History  Procedure Date  . Tubal ligation    Family History  Problem Relation Age of Onset  . Multiple sclerosis Maternal Grandmother    Social History:  reports that she has been smoking Cigarettes.  She has been smoking about 3 packs per day. She has never used smokeless tobacco. She reports that she drinks alcohol. She reports that she does not use illicit drugs. Allergies: No Known Allergies No prescriptions prior to admission    Home: Home Living Lives With: Family (lives with sister and her children) Available Help at Discharge: Family;Available PRN/intermittently Type of Home: House Home Layout: Two level (pt lives in basement; kitchen upstairs) Alternate Level Stairs-Number of Steps:  flight Alternate Level Stairs-Rails: Right Bathroom Shower/Tub: Tub/shower unit;Curtain Bathroom Toilet: Standard Home Adaptive Equipment: Straight cane;Hand-held shower hose  Functional History: Prior Function Driving: Yes Comments: Pt reports she has has many falls at home and out in the community.  She frequently uses motorized scooters at stores. Functional Status:  Mobility: Bed Mobility Bed Mobility: Supine to Sit Supine to Sit: 6: Modified independent (Device/Increase time) Transfers Transfers: Sit to Stand;Stand to Sit Sit to Stand: 4: Min assist;With upper extremity assist;From bed Stand to Sit: 4: Min assist;With upper extremity assist;To bed Ambulation/Gait Ambulation/Gait Assistance: 4: Min assist Ambulation Distance (Feet): 80 Feet Assistive device: Rolling walker Ambulation/Gait Assistance Details: patient tremulous throughout gait requiring assistance for safety.  No obvious loss of balance, however, concerned about tremors throwing off balance.   Gait Pattern: Step-to pattern;Decreased dorsiflexion - left;Decreased dorsiflexion - right;Right steppage;Left steppage;Ataxic (tremulous throughout mobility) Stairs: No    ADL: ADL Eating/Feeding: Performed;Modified independent Where Assessed - Eating/Feeding: Edge of bed Grooming: Performed;Applying makeup;Set up (setup to retrieve grooming items) Where Assessed - Grooming: Unsupported sitting Upper Body Bathing: Simulated;Set up Where Assessed - Upper Body Bathing: Unsupported sitting Lower Body Bathing: Simulated;Minimal assistance Where Assessed - Lower Body Bathing: Supported sit to stand Upper Body Dressing: Simulated;Set up Where Assessed - Upper Body Dressing: Unsupported sitting Lower Body Dressing: Performed;Minimal assistance (min assist for balance and steadying) Where Assessed - Lower Body Dressing: Sopported sit to stand Toilet Transfer: Performed;Minimal assistance Toilet Transfer Method:   (ambulating) Toilet Transfer Equipment: Raised toilet seat with arms (or 3-in-1 over toilet) Equipment Used: Rolling walker;Gait belt Transfers/Ambulation Related to ADLs: Pt ambulated with min assist with RW for balance as she has significant tremors during ambulation. No LOB. ADL Comments: Min assist for dynamic standing  balance tasks.  Cognition: Cognition Arousal/Alertness: Awake/alert Orientation Level: Oriented to person;Oriented to place;Oriented to time Cognition Overall Cognitive Status: Appears within functional limits for tasks assessed/performed Arousal/Alertness: Awake/alert Orientation Level: Appears intact for tasks assessed Behavior During Session: [facility] for tasks performed  Blood pressure 108/70, pulse 75, temperature 98.4 F (36.9 C), temperature source Oral, resp. rate 19, height 5\' 9"  (1.753 m), weight 63.7 kg (140 lb 6.9 oz), SpO2 99.00%. Physical Exam  Vitals reviewed. Constitutional: She is oriented to person, place, and time. She appears well-developed.  HENT:  Head: Normocephalic.  Neck: Neck supple. No thyromegaly present.  Cardiovascular: Regular rhythm.   Pulmonary/Chest: Breath sounds normal. She has no wheezes.  Abdominal: She exhibits no distension.  Musculoskeletal: She exhibits no edema.  Neurological: She is alert and oriented to person, place, and time.       Speech is a bit slow to respond but fully intelligible. She is oriented to person place situation. Upper extremity strength grossly 2+ to 3/5 proximal. Hand intrinsics and wrist extensors are 3-3+. Lower extremity strength grossly 1-2+ out of 5 and consistent. She is tone in the lower extremities grossly two out of four. Reflexes are hyper reflexive in both lower limbs and 2+ to 3+ in the upper limbs today. Sensory exam is diminished throughout the left side at 1-1+ out of 2 to pinprick and light touch. No gross cranial nerve deficits were appreciated today.  Skin: Skin is warm and dry.   Psychiatric: She has a normal mood and affect.    Results for orders placed during the hospital encounter of 08/11/11 (from the past 24 hour(s))  BASIC METABOLIC PANEL     Status: Abnormal   Collection Time   08/13/11  5:34 AM      Component Value Range   Sodium 140  135 - 145 (mEq/L)   Potassium 3.8  3.5 - 5.1 (mEq/L)   Chloride 108  96 - 112 (mEq/L)   CO2 22  19 - 32 (mEq/L)   Glucose, Bld 131 (*) 70 - 99 (mg/dL)   BUN 8  6 - 23 (mg/dL)   Creatinine, Ser 2.95  0.50 - 1.10 (mg/dL)   Calcium 9.1  8.4 - 62.1 (mg/dL)   GFR calc non Af Amer >90  >90 (mL/min)   GFR calc Af Amer >90  >90 (mL/min)   Dg Chest 1 View  08/12/2011  *RADIOLOGY REPORT*  Clinical Data: Tremors with memory loss.  Leukocytosis.  CHEST - 1 VIEW  Comparison: None.  Findings: Normal heart size with clear lung fields.  No bony abnormality.  IMPRESSION: Negative exam.  Original Report Authenticated By: Elsie Stain, M.D.   Ct Head Wo Contrast  08/11/2011  *RADIOLOGY REPORT*  Clinical Data: Tremors with lethargy  CT HEAD WITHOUT CONTRAST  Technique:  Contiguous axial images were obtained from the base of the skull through the vertex without contrast.  Comparison: 01/25/2011.  Findings: There is no evidence for acute infarction, intracranial hemorrhage, mass lesion, hydrocephalus, or extra-axial fluid. Premature atrophy, cerebellar greater than cerebral, is accompanied by numerous periventricular white matter lesions suggesting multiple sclerosis. A similar appearance was seen previously.  Calvarium is intact.  Clear sinuses and mastoids.  IMPRESSION: Premature atrophy along with multiple suspected white matter lesions is consistent with multiple sclerosis.  There is no significant progression from previous CT head.  No visible acute stroke or bleed.  Original Report Authenticated By: Elsie Stain, M.D.   [name] Contrast  08/12/2011  *  RADIOLOGY REPORT*  Clinical data:  Multiple sclerosis.  No muscular decline.  MRI  HEAD WITHOUT AND WITH CONTRAST  Technique:  Multiplanar, multiecho pulse sequences of the brain and surrounding structures were obtained without and with intravenous contrast.  Contrast: 13mL MULTIHANCE GADOBENATE DIMEGLUMINE 529 MG/ML IV SOLN  Comparison:  Head CT 08/11/2011  Findings:  There is abnormal FLAIR and T2 signal throughout the brain in a pattern consistent with advanced chronic multiple sclerosis.  There is involvement are seen within the pons, middle cerebellar peduncles, hemispheric white matter, thalami, hemispheric deep white matter and hemispheric subcortical white matter.  There is extensive involvement of the corpus callosum. There are a few areas of cortical involvement.  None of these shows restricted diffusion.  There is only a single focus in the right frontal lobe that shows any contrast enhancement.  No evidence of mass lesion, hemorrhage, hydrocephalus or extra-axial collection. No calvarial abnormality.  Sinuses, middle ears and mastoids are clear.  IMPRESSION: Widespread chronic appearing white matter disease with a pattern typical of chronic multiple sclerosis.  No lesions show restricted diffusion.  A single lesion in the right frontal lobe shows mild contrast enhancement.  MRI CERVICAL SPINE WITHOUT AND WITH CONTRAST  Technique:  Multiplanar and multiecho pulse sequences of the cervic al spine, to include the craniocervical junction and cervicothoraci c junction, were obtained without and with intravenous contrast.  Findings:  There is no significant degenerative disease in the cervical region.  No spinal stenosis.  No foraminal stenosis.  No osseous or articular pathology.  There are numerous areas of abnormal T2 signal within the cervical spinal cord consistent with multiple sclerosis involvement of the cervical spine.  None of these shows contrast enhancement.  No evidence of cord hemorrhage.  No evidence of syrinx or cord atrophy.  IMPRESSION: Multifocal T2 abnormality throughout  the cervical spinal cord consistent with multiple sclerosis involvement of the cord.  Original Report Authenticated By: Thomasenia Sales, M.D.   Mr Cervical Spine W Wo Contrast  08/12/2011  *RADIOLOGY REPORT*  Clinical data:  Multiple sclerosis.  No muscular decline.  MRI HEAD WITHOUT AND WITH CONTRAST  Technique:  Multiplanar, multiecho pulse sequences of the brain and surrounding structures were obtained without and with intravenous contrast.  Contrast: 13mL MULTIHANCE GADOBENATE DIMEGLUMINE 529 MG/ML IV SOLN  Comparison:  Head CT 08/11/2011  Findings:  There is abnormal FLAIR and T2 signal throughout the brain in a pattern consistent with advanced chronic multiple sclerosis.  There is involvement are seen within the pons, middle cerebellar peduncles, hemispheric white matter, thalami, hemispheric deep white matter and hemispheric subcortical white matter.  There is extensive involvement of the corpus callosum. There are a few areas of cortical involvement.  None of these shows restricted diffusion.  There is only a single focus in the right frontal lobe that shows any contrast enhancement.  No evidence of mass lesion, hemorrhage, hydrocephalus or extra-axial collection. No calvarial abnormality.  Sinuses, middle ears and mastoids are clear.  IMPRESSION: Widespread chronic appearing white matter disease with a pattern typical of chronic multiple sclerosis.  No lesions show restricted diffusion.  A single lesion in the right frontal lobe shows mild contrast enhancement.  MRI CERVICAL SPINE WITHOUT AND WITH CONTRAST  Technique:  Multiplanar and multiecho pulse sequences of the cervic al spine, to include the craniocervical junction and cervicothoraci c junction, were obtained without and with intravenous contrast.  Findings:  There is no significant degenerative disease in the cervical region.  No  spinal stenosis.  No foraminal stenosis.  No osseous or articular pathology.  There are numerous areas of abnormal T2  signal within the cervical spinal cord consistent with multiple sclerosis involvement of the cervical spine.  None of these shows contrast enhancement.  No evidence of cord hemorrhage.  No evidence of syrinx or cord atrophy.  IMPRESSION: Multifocal T2 abnormality throughout the cervical spinal cord consistent with multiple sclerosis involvement of the cord.  Original Report Authenticated By: Thomasenia Sales, M.D.    Assessment/Plan: Diagnosis: MS exacerbation 1. Does the need for close, 24 hr/day medical supervision in concert with the patient's rehab needs make it unreasonable for this patient to be served in a less intensive setting? Yes 2. Co-Morbidities requiring supervision/potential complications: Spasticity 3. Due to bladder management, bowel management, safety, skin/wound care, disease management, medication administration, pain management and patient education, does the patient require 24 hr/day rehab nursing? Yes 4. Does the patient require coordinated care of a physician, rehab nurse, PT (1-2 hrs/day, 5 days/week), OT (1-2 hrs/day, 5 days/week) and SLP (1-2 hrs/day, 5 days/week) to address physical and functional deficits in the context of the above medical diagnosis(es)? Yes Addressing deficits in the following areas: balance, endurance, locomotion, strength, transferring, bowel/bladder control, bathing, dressing, feeding, grooming, toileting, cognition and psychosocial support 5. Can the patient actively participate in an intensive therapy program of at least 3 hrs of therapy per day at least 5 days per week? Yes 6. The potential for patient to make measurable gains while on inpatient rehab is excellent 7. Anticipated functional outcomes upon discharge from inpatient rehab are minimal assistance with PT, supervision to minimal plus assistance with OT, supervision to modified independent with SLP. 8. Estimated rehab length of stay to reach the above functional goals is: 2 weeks plus 9. Does  the patient have adequate social supports to accommodate these discharge functional goals? Yes and Potentially 10. Anticipated D/C setting: Home 11. Anticipated post D/C treatments: HH therapy 12. Overall Rehab/Functional Prognosis: good  RECOMMENDATIONS: This patient's condition is appropriate for continued rehabilitative care in the following setting: CIR Patient has agreed to participate in recommended program. Yes Note that insurance prior authorization may be required for reimbursement for recommended care.  Comment: Need to followup on family supports.   Ranelle Oyster M.D. 08/13/2011

## 2011-08-13 NOTE — Progress Notes (Signed)
History:  36yo with progressive diffuse weakness, slow responses to questions with appropriate answers. Day #2 of IV methylprednolisone  Subjective:  Patient says that she is stronger today and that she feels like she can answer questions "quicker" than before admission. I discussed the findings with her MRI which showed T2 lesions consistent with multiple sclerosis as suspected.   Objective: BP 108/70  Pulse 75  Temp(Src) 98.4 F (36.9 C) (Oral)  Resp 19  Ht 5\' 9"  (1.753 m)  Wt 63.7 kg (140 lb 6.9 oz)  BMI 20.74 kg/m2  SpO2 99%  CBGs No results found for this basename: GLUCAP:10 in the last 72 hours   Medications: Scheduled:   . enoxaparin  40 mg Subcutaneous Q24H  . gadobenate dimeglumine  13 mL Intravenous Once  . methylPREDNISolone (SOLU-MEDROL) injection  1,000 mg Intravenous Q24H  . DISCONTD: methylPREDNISolone (SOLU-MEDROL) injection  1,000 mg Intravenous Q24H    History obtained from chart review and the patient  General ROS: negative for - chills, fatigue, fever, night sweats, weight gain or weight loss Psychological ROS: negative for - behavioral disorder, hallucinations, memory difficulties, mood swings or suicidal ideation Ophthalmic ROS: negative for - blurry vision, double vision, eye pain or loss of vision ENT ROS: negative for - epistaxis, nasal discharge, oral lesions, sore throat, tinnitus or vertigo Allergy and Immunology ROS: negative for - hives or itchy/watery eyes Hematological and Lymphatic ROS: negative for - bleeding problems, bruising or swollen lymph nodes Endocrine ROS: negative for - galactorrhea, hair pattern changes, polydipsia/polyuria or temperature intolerance Respiratory ROS: negative for - cough, hemoptysis, shortness of breath or wheezing Cardiovascular ROS: negative for - chest pain, dyspnea on exertion, edema or irregular heartbeat Gastrointestinal ROS: negative for - abdominal pain, diarrhea, hematemesis, nausea/vomiting or stool  incontinence Genito-Urinary ROS: negative for - dysuria, hematuria, incontinence or urinary frequency/urgency Musculoskeletal ROS: negative for - joint swelling, + muscular weakness Neurological ROS: as noted in HPI Dermatological ROS: negative for rash and skin lesion changes    Neurologic Exam: Mental Status: Alert, oriented, thought content appropriate.  Speech fluent without evidence of aphasia.  Appropriate answers to questions; quicker response today. Able to follow 3 step commands without difficulty. Cranial Nerves: II- Visual fields grossly intact. III/IV/VI-Extraocular movements intact.  Pupils reactive bilaterally. V/VII-Smile symmetric VIII-hearing grossly intact IX/X-normal gag XI-bilateral shoulder shrug XII-midline tongue extension Motor: 5/5 bilaterally of UE, 4/5 hands bilaterally and LE bilaterally.  Sensory: Pinprick and light touch decreased on the left but improved per patient. Deep Tendon Reflexes: 2+ and symmetric throughout Plantars: mute Cerebellar: Normal finger-to-nose and normal heel-to-shin test.   Lab Results: CBC:  Lab 08/12/11 0330 08/11/11 2200  WBC 7.9 12.7*  NEUTROABS -- 7.7  HGB 10.9* 13.7  HCT 32.0* 41.9  MCV 87.9 89.5  PLT 212 243   Basic Metabolic Panel:  Lab 08/13/11 2130 08/12/11 0330  NA 140 139  K 3.8 3.8  CL 108 109  CO2 22 23  GLUCOSE 131* 117*  BUN 8 12  CREATININE 0.62 0.82  CALCIUM 9.1 8.3*  MG -- --  PHOS -- --   Liver Function Tests:  Lab 08/12/11 0330 08/11/11 2200  AST 14 20  ALT 10 16  ALKPHOS 59 85  BILITOT 0.4 0.3  PROT 5.7* 8.2  ALBUMIN 3.2* 4.9    Study Results: Dg Chest 1 View  08/12/2011  *RADIOLOGY REPORT*  Clinical Data: Tremors with memory loss.  Leukocytosis.  CHEST - 1 VIEW  Comparison: None.  Findings: Normal heart  size with clear lung fields.  No bony abnormality.  IMPRESSION: Negative exam.  Original Report Authenticated By: Elsie Stain, M.D.   Ct Head Wo Contrast  08/11/2011   *RADIOLOGY REPORT*  Clinical Data: Tremors with lethargy  CT HEAD WITHOUT CONTRAST  Technique:  Contiguous axial images were obtained from the base of the skull through the vertex without contrast.  Comparison: 01/25/2011.  Findings: There is no evidence for acute infarction, intracranial hemorrhage, mass lesion, hydrocephalus, or extra-axial fluid. Premature atrophy, cerebellar greater than cerebral, is accompanied by numerous periventricular white matter lesions suggesting multiple sclerosis. A similar appearance was seen previously.  Calvarium is intact.  Clear sinuses and mastoids.  IMPRESSION: Premature atrophy along with multiple suspected white matter lesions is consistent with multiple sclerosis.  There is no significant progression from previous CT head.  No visible acute stroke or bleed.  Original Report Authenticated By: Elsie Stain, M.D.   Mr Laqueta Jean WU Contrast  08/12/2011  *RADIOLOGY REPORT*  Clinical data:  Multiple sclerosis.  No muscular decline.  MRI HEAD WITHOUT AND WITH CONTRAST  Technique:  Multiplanar, multiecho pulse sequences of the brain and surrounding structures were obtained without and with intravenous contrast.  Contrast: 13mL MULTIHANCE GADOBENATE DIMEGLUMINE 529 MG/ML IV SOLN  Comparison:  Head CT 08/11/2011  Findings:  There is abnormal FLAIR and T2 signal throughout the brain in a pattern consistent with advanced chronic multiple sclerosis.  There is involvement are seen within the pons, middle cerebellar peduncles, hemispheric white matter, thalami, hemispheric deep white matter and hemispheric subcortical white matter.  There is extensive involvement of the corpus callosum. There are a few areas of cortical involvement.  None of these shows restricted diffusion.  There is only a single focus in the right frontal lobe that shows any contrast enhancement.  No evidence of mass lesion, hemorrhage, hydrocephalus or extra-axial collection. No calvarial abnormality.  Sinuses,  middle ears and mastoids are clear.  IMPRESSION: Widespread chronic appearing white matter disease with a pattern typical of chronic multiple sclerosis.  No lesions show restricted diffusion.  A single lesion in the right frontal lobe shows mild contrast enhancement.  MRI CERVICAL SPINE WITHOUT AND WITH CONTRAST  Technique:  Multiplanar and multiecho pulse sequences of the cervic al spine, to include the craniocervical junction and cervicothoraci c junction, were obtained without and with intravenous contrast.  Findings:  There is no significant degenerative disease in the cervical region.  No spinal stenosis.  No foraminal stenosis.  No osseous or articular pathology.  There are numerous areas of abnormal T2 signal within the cervical spinal cord consistent with multiple sclerosis involvement of the cervical spine.  None of these shows contrast enhancement.  No evidence of cord hemorrhage.  No evidence of syrinx or cord atrophy.  IMPRESSION: Multifocal T2 abnormality throughout the cervical spinal cord consistent with multiple sclerosis involvement of the cord.  Original Report Authenticated By: Thomasenia Sales, M.D.   Mr Cervical Spine W Wo Contrast  08/12/2011  *RADIOLOGY REPORT*  Clinical data:  Multiple sclerosis.  No muscular decline.  MRI HEAD WITHOUT AND WITH CONTRAST  Technique:  Multiplanar, multiecho pulse sequences of the brain and surrounding structures were obtained without and with intravenous contrast.  Contrast: 13mL MULTIHANCE GADOBENATE DIMEGLUMINE 529 MG/ML IV SOLN  Comparison:  Head CT 08/11/2011  Findings:  There is abnormal FLAIR and T2 signal throughout the brain in a pattern consistent with advanced chronic multiple sclerosis.  There is involvement are seen within the  pons, middle cerebellar peduncles, hemispheric white matter, thalami, hemispheric deep white matter and hemispheric subcortical white matter.  There is extensive involvement of the corpus callosum. There are a few areas of  cortical involvement.  None of these shows restricted diffusion.  There is only a single focus in the right frontal lobe that shows any contrast enhancement.  No evidence of mass lesion, hemorrhage, hydrocephalus or extra-axial collection. No calvarial abnormality.  Sinuses, middle ears and mastoids are clear.  IMPRESSION: Widespread chronic appearing white matter disease with a pattern typical of chronic multiple sclerosis.  No lesions show restricted diffusion.  A single lesion in the right frontal lobe shows mild contrast enhancement.  MRI CERVICAL SPINE WITHOUT AND WITH CONTRAST  Technique:  Multiplanar and multiecho pulse sequences of the cervic al spine, to include the craniocervical junction and cervicothoraci c junction, were obtained without and with intravenous contrast.  Findings:  There is no significant degenerative disease in the cervical region.  No spinal stenosis.  No foraminal stenosis.  No osseous or articular pathology.  There are numerous areas of abnormal T2 signal within the cervical spinal cord consistent with multiple sclerosis involvement of the cervical spine.  None of these shows contrast enhancement.  No evidence of cord hemorrhage.  No evidence of syrinx or cord atrophy.  IMPRESSION: Multifocal T2 abnormality throughout the cervical spinal cord consistent with multiple sclerosis involvement of the cord.  Original Report Authenticated By: Thomasenia Sales, M.D.      Assessment/Plan:  1) Multiple Sclerosis    -Day #2 IV methylprednolisone  -Social Work Support for financial/medical needs post hospitalization  -PT/OT recommends inpatient rehab  -will order GI prophylaxis   -Discussed in detail with the patient and inpatient and outpatient course of continued lifelong care.   LOS: 2 days   Guy Franco [facility] Pager [phone]  08/13/2011  10:01 AM

## 2011-08-13 NOTE — Progress Notes (Signed)
Patient IDSamanta Estrada  female  RUE:[id number]    DOB: 1976/02/24    DOA: 08/11/2011  PCP: Erlinda Hong, MD, MD  Subjective: Feels some strength in legs coming back, working with PT OT  Objective: Weight change:  No intake or output data in the 24 hours ending 08/13/11 1257 Blood pressure 108/70, pulse 75, temperature 98.4 F (36.9 C), temperature source Oral, resp. rate 19, height 5\' 9"  (1.753 m), weight 63.7 kg (140 lb 6.9 oz), SpO2 99.00%.  Physical Exam: General: Alert and awake, oriented x3, not in any acute distress. HEENT: anicteric sclera, pupils reactive to light and accommodation, EOMI CVS: S1-S2 clear, no murmur rubs or gallops Chest: clear to auscultation bilaterally, no wheezing, rales or rhonchi Abdomen: soft nontender, nondistended, normal bowel sounds, no organomegaly Extremities: no cyanosis, clubbing or edema noted bilaterally   Lab Results: Basic Metabolic Panel:  Lab 08/13/11 1478 08/12/11 0330  NA 140 139  K 3.8 3.8  CL 108 109  CO2 22 23  GLUCOSE 131* 117*  BUN 8 12  CREATININE 0.62 0.82  CALCIUM 9.1 8.3*  MG -- --  PHOS -- --   Liver Function Tests:  Lab 08/12/11 0330 08/11/11 2200  AST 14 20  ALT 10 16  ALKPHOS 59 85  BILITOT 0.4 0.3  PROT 5.7* 8.2  ALBUMIN 3.2* 4.9    Lab 08/11/11 2201  AMMONIA 71*   CBC:  Lab 08/12/11 0330 08/11/11 2200  WBC 7.9 12.7*  NEUTROABS -- 7.7  HGB 10.9* 13.7  HCT 32.0* 41.9  MCV 87.9 89.5  PLT 212 243    Studies/Results: Dg Chest 1 View  08/12/2011  *RADIOLOGY REPORT*  Clinical Data: Tremors with memory loss.  Leukocytosis.  CHEST - 1 VIEW  Comparison: None.  Findings: Normal heart size with clear lung fields.  No bony abnormality.  IMPRESSION: Negative exam.  Original Report Authenticated By: Elsie Stain, M.D.   Ct Head Wo Contrast  08/11/2011    IMPRESSION: Premature atrophy along with multiple suspected white matter lesions is consistent with multiple sclerosis.  There is no significant  progression from previous CT head.  No visible acute stroke or bleed.  Original Report Authenticated By: Elsie Stain, M.D.    MRI of the brain 08/12/2011 IMPRESSION:  Widespread chronic appearing white matter disease with a pattern typical of chronic multiple sclerosis. No lesions show restricted diffusion. A single lesion in the right frontal lobe shows mild contrast enhancement.    MRI CERVICAL SPINE WITHOUT AND WITH CONTRAST 08/12/2011 IMPRESSION:  Multifocal T2 abnormality throughout the cervical spinal cord consistent with multiple sclerosis involvement of the cord.  Medications: Scheduled Meds:    . enoxaparin  40 mg Subcutaneous Q24H  . gadobenate dimeglumine  13 mL Intravenous Once  . methylPREDNISolone (SOLU-MEDROL) injection  1,000 mg Intravenous Q24H  . pantoprazole  40 mg Oral Q1200   Continuous Infusions:    . 0.9 % NaCl with KCl 20 mEq / L 75 mL/hr at 08/12/11 1813     Assessment/Plan: Principal Problem:   Neuromuscular disease concerning for multiple sclerosis: Newly diagnosed - MRI brain and cervical spine consistent with multiple sclerosis  - Continue high-dose IV Solu-Medrol, day #2, neurology following  - PTOT recommending inpatient rehabilitation - Will need social work assistance for [employer] and outpatient management  Drug use: UDS positive for [facility] - Social worker consult for substance abuse  Hyper ammoniemia: NH3 71:  No mental status changes  DVT Prophylaxis: SCDs  Code Status: Full  code  Disposition: CIR consult placed   LOS: 2 days   Arlyce Circle M.D. [facility] 08/13/2011, 12:57 PM Pager: [phone]

## 2011-08-13 NOTE — Clinical Social Work Note (Signed)
CSW spoke with Financial Counselor, Frances Maywood, who confirmed that pt will be filling out a Medicaid application and will be following. CSW will continue to follow to provide psychosocial support and additional community resources.   Dede Query, MSW, Theresia Majors [phone]

## 2011-08-13 NOTE — Progress Notes (Signed)
Occupational Therapy Treatment Patient Details Name: Jeanette Estrada MRN: [id number] DOB: 18-Nov-1975 Today's Date: 08/13/2011 Time: 2952-8413 OT Time Calculation (min): 25 min  OT Assessment / Plan / Recommendation Comments on Treatment Session Pt very motivated to work with therapy    Follow Up Recommendations  Inpatient Rehab    Barriers to Discharge       Equipment Recommendations  Defer to next venue    Recommendations for Other Services Rehab consult  Frequency     Plan Discharge plan remains appropriate    Precautions / Restrictions Precautions Precautions: Fall Restrictions Weight Bearing Restrictions: No   Pertinent Vitals/Pain Pt denies any pain    ADL  Grooming: Performed;Wash/dry hands;Wash/dry face;Supervision/safety Where Assessed - Grooming:  (standing at sink) Toilet Transfer: Simulated;Minimal assistance Toilet Transfer Method: Sit to stand ((from bed)) Toileting - Clothing Manipulation and Hygiene: Simulated;Minimal assistance Where Assessed - Toileting Clothing Manipulation and Hygiene: Standing Transfers/Ambulation Related to ADLs: Min A RW ambulation >24MW. Pt with shaking movement once standing and during ambulation. Appeared as though mostly occuring at knees. Pt's left foot also slightly turns out and occasionally would get caught on RW- VC's to be mindful of this. ADL Comments: Educated pt on energy conservation and it's importance with MS or MS like disease processes.     OT Diagnosis:    OT Problem List:   OT Treatment Interventions:     OT Goals ADL Goals ADL Goal: Grooming - Progress: Progressing toward goals Miscellaneous OT Goals OT Goal: Miscellaneous Goal #1 - Progress: Progressing toward goals OT Goal: Miscellaneous Goal #2 - Progress: Progressing toward goals OT Goal: Miscellaneous Goal #3 - Progress: Progressing toward goals  Visit Information  Last OT Received On: 08/13/11 Assistance Needed: +1 PT/OT Co-Evaluation/Treatment:  Yes    Subjective Data      Prior Functioning       Cognition  Overall Cognitive Status: Appears within functional limits for tasks assessed/performed Arousal/Alertness: Awake/alert Orientation Level: Appears intact for tasks assessed Behavior During Session: [facility] for tasks performed Cognition - Other Comments: speech is slow and purposeful, but intelligible    Mobility Bed Mobility Bed Mobility: Not assessed Transfers Sit to Stand: 4: Min assist;With upper extremity assist;From bed Stand to Sit: 4: Min assist;With upper extremity assist;To bed Details for Transfer Assistance: pt shaking and requires MinA for steadying.  Cues for use of UEs.     Exercises    Balance Balance Balance Assessed: Yes Static Standing Balance Static Standing - Balance Support: No upper extremity supported;During functional activity Static Standing - Level of Assistance: 5: Stand by assistance Static Standing - Comment/# of Minutes: pt able to stand with hips leaned against sink and stand with Supervision/Min guard A for hand hygiene. pt still with tremors  End of Session OT - End of Session Equipment Utilized During Treatment: Gait belt Activity Tolerance: Patient tolerated treatment well Patient left: in bed;with call bell/phone within reach;with bed alarm set Nurse Communication: Mobility status   Craig Wisnewski 08/13/2011, 2:02 PM

## 2011-08-14 ENCOUNTER — Inpatient Hospital Stay (HOSPITAL_COMMUNITY)
Admission: RE | Admit: 2011-08-14 | Discharge: 2011-08-20 | DRG: 945 | Disposition: A | Discharge status: Home Health | Payer: Medicaid Other | Source: Ambulatory Visit | Attending: Physical Medicine & Rehabilitation | Admitting: Physical Medicine & Rehabilitation

## 2011-08-14 DIAGNOSIS — Z5189 Encounter for other specified aftercare: Secondary | ICD-10-CM

## 2011-08-14 DIAGNOSIS — G35 Multiple sclerosis: Secondary | ICD-10-CM

## 2011-08-14 DIAGNOSIS — R5381 Other malaise: Secondary | ICD-10-CM

## 2011-08-14 DIAGNOSIS — R5383 Other fatigue: Secondary | ICD-10-CM

## 2011-08-14 DIAGNOSIS — G8190 Hemiplegia, unspecified affecting unspecified side: Secondary | ICD-10-CM

## 2011-08-14 DIAGNOSIS — Z79899 Other long term (current) drug therapy: Secondary | ICD-10-CM

## 2011-08-14 DIAGNOSIS — N319 Neuromuscular dysfunction of bladder, unspecified: Secondary | ICD-10-CM

## 2011-08-14 DIAGNOSIS — G35D Multiple sclerosis, unspecified: Secondary | ICD-10-CM

## 2011-08-14 DIAGNOSIS — F1210 Cannabis abuse, uncomplicated: Secondary | ICD-10-CM

## 2011-08-14 DIAGNOSIS — R259 Unspecified abnormal involuntary movements: Secondary | ICD-10-CM

## 2011-08-14 LAB — BASIC METABOLIC PANEL
CO2: 22 mEq/L (ref 19–32)
Chloride: 110 mEq/L (ref 96–112)
GFR calc Af Amer: >90 mL/min (ref >90)
Potassium: 4.1 mEq/L (ref 3.5–5.1)
Sodium: 141 mEq/L (ref 135–145)

## 2011-08-14 MED ORDER — POLYETHYLENE GLYCOL 3350 17 G PO PACK
17.0000 g | PACK | Freq: Every day | ORAL | Status: DC | PRN
  Filled 2011-08-14: qty 1

## 2011-08-14 MED ORDER — SORBITOL 70 % SOLN: 30.0000 mL | Freq: Every day | Status: DC | PRN

## 2011-08-14 MED ORDER — ENOXAPARIN SODIUM 40 MG/0.4ML ~~LOC~~ SOLN
40.0000 mg | Freq: Every day | SUBCUTANEOUS | Status: DC
  Administered 2011-08-15 – 2011-08-20 (×6): 40 mg via SUBCUTANEOUS
  Filled 2011-08-14 (×9): qty 0.4

## 2011-08-14 MED ORDER — ACETAMINOPHEN 325 MG PO TABS
325.0000 mg | ORAL_TABLET | ORAL | Status: DC | PRN
  Administered 2011-08-16 – 2011-08-19 (×4): 650 mg via ORAL
  Filled 2011-08-14 (×4): qty 2

## 2011-08-14 MED ORDER — ONDANSETRON HCL 4 MG PO TABS: 4.0000 mg | ORAL_TABLET | Freq: Four times a day (QID) | ORAL | Status: DC | PRN

## 2011-08-14 MED ORDER — SODIUM CHLORIDE 0.9 % IV SOLN
1000.0000 mg | INTRAVENOUS | Status: AC
  Administered 2011-08-14 – 2011-08-17 (×4): 1000 mg via INTRAVENOUS
  Filled 2011-08-14 (×4): qty 8

## 2011-08-14 MED ORDER — ONDANSETRON HCL 4 MG/2ML IJ SOLN: 4.0000 mg | Freq: Four times a day (QID) | INTRAMUSCULAR | Status: DC | PRN

## 2011-08-14 MED ORDER — PANTOPRAZOLE SODIUM 40 MG PO TBEC
40.0000 mg | DELAYED_RELEASE_TABLET | Freq: Every day | ORAL | Status: DC
  Administered 2011-08-15 – 2011-08-18 (×4): 40 mg via ORAL
  Filled 2011-08-14 (×5): qty 1

## 2011-08-14 NOTE — Progress Notes (Signed)
Occupational Therapy Treatment Patient Details Name: Jeanette Estrada MRN: [id number] DOB: [birth year] Today's Date: 08/14/2011 Time: 5638-7564 OT Time Calculation (min): 40 min  OT Assessment / Plan / Recommendation Comments on Treatment Session Pt continues to be an excellent candidate for CIR.  Requires frequent verbal cueing for safety with DME and education on energy conservation techniques that will benefit her with new diagnosis.     Follow Up Recommendations  Inpatient Rehab    Barriers to Discharge       Equipment Recommendations  Defer to next venue    Recommendations for Other Services Rehab consult  Frequency Min 2X/week   Plan Discharge plan remains appropriate    Precautions / Restrictions Precautions Precautions: Fall Restrictions Weight Bearing Restrictions: No   Pertinent Vitals/Pain See vitals    ADL  Grooming: Performed;Wash/dry hands;Supervision/safety Where Assessed - Grooming: Unsupported standing Upper Body Dressing: Performed;Minimal assistance Where Assessed - Upper Body Dressing: Unsupported sitting Lower Body Dressing: Performed;Min guard Where Assessed - Lower Body Dressing: Sopported sit to stand Toilet Transfer: Performed;Minimal assistance Toilet Transfer Method:  (ambulating) Acupuncturist: Regular height toilet Toileting - Clothing Manipulation and Hygiene: Performed;Min guard Where Assessed - Engineer, mining and Hygiene: Sit to stand from 3-in-1 or toilet Equipment Used: Rolling walker;Back brace Transfers/Ambulation Related to ADLs: Min assist with RW and mod verbal cueing for safety with RW. ADL Comments: Pt performed UB/LB dressing with cueing for energy conservation techniques.  Educated pt to thread LEs through both undergarment and pants and stand 1x to pull both articles of clothing up over hips simutaneously.  Edcuated pt on safe use of RW when transporting items, such as draping clothing over front of RW.      OT Diagnosis:    OT Problem List:   OT Treatment Interventions:     OT Goals ADL Goals Pt Will Perform Grooming: with modified independence;Standing at sink;Sitting at sink ADL Goal: Grooming - Progress: Progressing toward goals Miscellaneous OT Goals Miscellaneous OT Goal #1: Pt will perform 3/3 toileting tasks with mod I. OT Goal: Miscellaneous Goal #1 - Progress: Progressing toward goals Miscellaneous OT Goal #2: Pt will be able to retrieve ADL items with mod I with LRD. OT Goal: Miscellaneous Goal #2 - Progress: Progressing toward goals Miscellaneous OT Goal #3: Pt will be able to verbalize and incorporate energy conservation techniques during 75% of ADL activity. OT Goal: Miscellaneous Goal #3 - Progress: Progressing toward goals  Visit Information  Assistance Needed: +1    Subjective Data      Prior Functioning       Cognition  Overall Cognitive Status: Appears within functional limits for tasks assessed/performed Arousal/Alertness: Awake/alert Orientation Level: Appears intact for tasks assessed Behavior During Session: [facility] for tasks performed    Mobility Bed Mobility Bed Mobility: Not assessed (Pt sitting EOB upon OT arrival) Supine to Sit: 6: Modified independent (Device/Increase time) Sit to Supine: 6: Modified independent (Device/Increase time) Transfers Transfers: Sit to Stand;Stand to Sit Sit to Stand: 4: Min guard;From bed;From toilet;With upper extremity assist Stand to Sit: To bed;To chair/3-in-1;To toilet;With armrests;With upper extremity assist;4: Min guard Details for Transfer Assistance: Pt with tremors however min guard for safety. No LOB.  Requires mod verbal cues for correct hand placement and technique.   Exercises    Balance   End of Session OT - End of Session Equipment Utilized During Treatment: Gait belt Activity Tolerance: Patient tolerated treatment well Patient left: in chair;with call bell/phone within reach Nurse Communication:  Mobility  status  08/14/2011 Cipriano Mile OTR/L Pager [phone] Office [phone]  Cipriano Mile 08/14/2011, 1:28 PM

## 2011-08-14 NOTE — Plan of Care (Signed)
Overall Plan of Care [facility]) Patient Details Name: Jozie Wulf MRN: [id number] DOB: 1975-10-15  Diagnosis:   rehabilitation for multiple sclerosis Primary Diagnosis:    MS (multiple sclerosis) Co-morbidities: neurogenic bladder, ataxia, optic neuritis  Functional Problem List  Patient demonstrates impairments in the following areas: Bladder, Bowel, Endurance, Medication Management, Pain, Safety, Sensory  and Skin Integrity, motor, neuromuscular, balance, cognition  Basic ADL's: grooming, bathing, dressing and toileting Advanced ADL's: simple meal preparation, laundry and light housekeeping  Transfers:  bed mobility, bed to chair, toilet, tub/shower, car, furniture and floor Locomotion:  ambulation and stairs  Additional Impairments:  Social Cognition   social interaction, problem solving, memory and attention  Anticipated Outcomes Item Anticipated Outcome  Eating/Swallowing  independent  Basic self-care  Modified Independent  Tolieting  Modified Independent  Bowel/Bladder  Modified independent, continent  Transfers  Mod I  Locomotion  Mod I household gait, S community gait and steps  Communication  Mod I  Cognition  Intermittent supervision-Mod I  Pain  3 or less on scale of 1-10  Safety/Judgment  supervision  Other     Therapy Plan: PT Frequency: 1-2 X/day, 60-90 minutes OT Frequency: 1-2 X/day, 60-90 minutes SLP Frequency: 1-2 X/day, 30-60 minutes ELOS <=1 week  Team Interventions: Item RN PT OT SLP SW TR Other  Self Care/Advanced ADL Retraining   x      Neuromuscular Re-Education  x x      Therapeutic Activities  x x x     UE/LE Strength Training/ROM  x x      UE/LE Coordination Activities  x x      Visual/Perceptual Remediation/Compensation  x x      DME/Adaptive Equipment Instruction  x x      Therapeutic Exercise  x x      Balance/Vestibular Training  x x      Patient/Family Education x x x x     Cognitive Remediation/Compensation  x x x       Functional Mobility Training  x x      Ambulation/Gait Training  x       Furniture conservator/restorer Reintegration  x x      Dysphagia/Aspiration Landscape architect Facilitation    x     Bladder Management x        Bowel Management x        Disease Management/Prevention x  x      Pain Management x        Medication Management x        Skin Care/Wound Management         Splinting/Orthotics         Discharge Planning x  x  x    Psychosocial Support x    x                       Team Discharge Planning: Destination:  Home Projected Follow-up:  PT, OT, SLP Projected Equipment Needs:  Walker, Tub bench Patient/family involved in discharge planning:  Yes  MD ELOS: 14d  Medical Rehab Prognosis:  Good Assessment: 36 year old female recently diagnosed with multiple sclerosis now requiring CIR level PT and OT for severe ataxia balance disorder as well as require 24 7 rehabilitation are and for  neurogenic bladder and safety issues as well as rehabilitation M.D. To adjust medications and provide medical supervision.

## 2011-08-14 NOTE — H&P (Signed)
Physical Medicine and Rehabilitation Admission H&P  Chief Complaint   Patient presents with   .  Tremors   :  HPI: 36 year old right-handed female admitted 08/12/2011 with progressive weakness in her extremities of the past few years with with excessive tremulousness. She states that she had never had a formal workup for multiple sclerosis. Patient on no formal medications prior to admission. She states she can barely walk at times and uses a cane as well as some slurred speech. MRI of the brain showed widespread chronic appearing white matter disease with a pattern typical of chronic multiple sclerosis. MRI cervical spine with numerous areas of abnormal T2 signal within the cervical spinal cord consistent with multiple sclerosis involvement of the cervical spine. Neurology consulted placed on high dose intravenous Solu-Medrol x5 days for MS exacerbation. Urine drug screen on admission was positive for marijuana. Subcutaneous Lovenox added for DVT prophylaxis. Physical and occupational therapy ongoing with recommendations for physical medicine rehabilitation consult to consider inpatient rehabilitation services  Review of Systems  Eyes: Positive for double vision.  Neurological: Positive for tingling and weakness.  All other systems reviewed and are negative  Past Medical History   Diagnosis  Date   .  Neuromuscular disease     Past Surgical History   Procedure  Date   .  Tubal ligation     Family History   Problem  Relation  Age of Onset   .  Multiple sclerosis  Maternal Grandmother     Social History: reports that she has been smoking Cigarettes. She has been smoking about 3 packs per day. She has never used smokeless tobacco. She reports that she drinks alcohol. She reports that she does not use illicit drugs.  Allergies: No Known Allergies  No prescriptions prior to admission    Home:  Home Living  Lives With: Family (lives with sister and her children)  Available Help at  Discharge: Family;Available PRN/intermittently  Type of Home: House  Home Layout: Two level (pt lives in basement; kitchen upstairs)  Alternate Level Stairs-Number of Steps: flight  Alternate Level Stairs-Rails: Right  Bathroom Shower/Tub: Tub/shower unit;Curtain  Bathroom Toilet: Standard  Home Adaptive Equipment: Straight cane;Hand-held shower hose  Functional History:  Prior Function  Driving: Yes  Comments: Pt reports she has has many falls at home and out in the community. She frequently uses motorized scooters at stores.  Functional Status:  Mobility:  Bed Mobility  Bed Mobility: Sit to Supine  Supine to Sit: 6: Modified independent (Device/Increase time)  Sit to Supine: 6: Modified independent (Device/Increase time)  Transfers  Transfers: Sit to Stand;Stand to Sit  Sit to Stand: 4: Min guard;From bed;With upper extremity assist  Stand to Sit: To bed;With upper extremity assist;4: Min guard  Ambulation/Gait  Ambulation/Gait Assistance: 4: Min assist  Ambulation Distance (Feet): 120 Feet  Assistive device: Rolling walker  Ambulation/Gait Assistance Details: Patient with tremors throughout ambulation, requiring steadying for balalnce. Patient L LE externally rotated and L knee hyperextended. Patient stated that this has been an issue for a while  Gait Pattern: Step-through pattern;Decreased dorsiflexion - right;Decreased dorsiflexion - left;Decreased stride length  Stairs: No  Wheelchair Mobility  Wheelchair Mobility: No  ADL:  ADL  Eating/Feeding: Performed;Modified independent  Where Assessed - Eating/Feeding: Edge of bed  Grooming: Performed;Wash/dry hands;Wash/dry face;Supervision/safety  Where Assessed - Grooming: (standing at sink)  Upper Body Bathing: Simulated;Set up  Where Assessed - Upper Body Bathing: Unsupported sitting  Lower Body Bathing: Simulated;Minimal assistance  Where Assessed -  Lower Body Bathing: Supported sit to stand  Upper Body Dressing:  Simulated;Set up  Where Assessed - Upper Body Dressing: Unsupported sitting  Lower Body Dressing: Performed;Minimal assistance (min assist for balance and steadying)  Where Assessed - Lower Body Dressing: Sopported sit to stand  Toilet Transfer: Simulated;Minimal assistance  Toilet Transfer Method: Sit to stand ((from bed))  Toilet Transfer Equipment: Raised toilet seat with arms (or 3-in-1 over toilet)  Equipment Used: Rolling walker;Gait belt  Transfers/Ambulation Related to ADLs: Min A RW ambulation >40JW. Pt with shaking movement once standing and during ambulation. Appeared as though mostly occuring at knees. Pt's left foot also slightly turns out and occasionally would get caught on RW- VC's to be mindful of this.  ADL Comments: Educated pt on energy conservation and it's importance with MS or MS like disease processes.  Cognition:  Cognition  Arousal/Alertness: Awake/alert  Orientation Level: Oriented X4  Cognition  Overall Cognitive Status: Appears within functional limits for tasks assessed/performed  Arousal/Alertness: Awake/alert  Orientation Level: Appears intact for tasks assessed  Behavior During Session: [facility] for tasks performed  Cognition - Other Comments: speech is slow and purposeful, but intelligible  Blood pressure 109/71, pulse 85, temperature 98.3 F (36.8 C), temperature source Oral, resp. rate 18, height 5\' 9"  (1.753 m), weight 63.7 kg (140 lb 6.9 oz), SpO2 100.00%.  Physical Exam  Vitals reviewed.  Constitutional: She is oriented to person, place, and time. She appears well-developed.  HENT: Oral mucosa pink and moist Head: Normocephalic. Pupils equal round and reactive to light, extraocular eye movements intact Neck: Neck supple. No thyromegaly present.  Cardiovascular: Regular rhythm. No murmurs rubs or gallop Pulmonary/Chest: Breath sounds normal. She has no wheezes.  Abdominal: She exhibits no distension. Bowel sounds positive  Musculoskeletal: She exhibits  no edema.  Neurological: She is alert and oriented to person, place, and time.  Speech is a bit slow to respond but fully intelligible. She has fair insight and awareness. Memory is fair to good. She is oriented to person place situation. Upper extremity strength grossly 3+ to 4/5 throughout. She did seem to have motor apraxia. Lower extremity strength has improved quite a bit and is 3-4/5 throughout again with inconsistent motor planning noted. Sensory exam is diminished throughout the left side at 1+ out of 2 to pinprick and light touch and more inconsistent today. Reflexes are hyper reflexive in the lower greater than the upper extremities.. No gross cranial nerve deficits were appreciated today.  Skin: Skin is warm and dry.  Psychiatric: She has a normal mood and affect  Results for orders placed during the hospital encounter of 08/11/11 (from the past 48 hour(s))   BASIC METABOLIC PANEL Status: Abnormal    Collection Time    08/13/11 5:34 AM   Component  Value  Range  Comment    Sodium  140  135 - 145 (mEq/L)     Potassium  3.8  3.5 - 5.1 (mEq/L)     Chloride  108  96 - 112 (mEq/L)     CO2  22  19 - 32 (mEq/L)     Glucose, Bld  131 (*)  70 - 99 (mg/dL)     BUN  8  6 - 23 (mg/dL)     Creatinine, Ser  1.19  0.50 - 1.10 (mg/dL)     Calcium  9.1  8.4 - 10.5 (mg/dL)     GFR calc non Af Amer  >90  >90 (mL/min)     GFR  calc Af Amer  >90  >90 (mL/min)    BASIC METABOLIC PANEL Status: Abnormal    Collection Time    08/14/11 5:45 AM   Component  Value  Range  Comment    Sodium  141  135 - 145 (mEq/L)     Potassium  4.1  3.5 - 5.1 (mEq/L)     Chloride  110  96 - 112 (mEq/L)     CO2  22  19 - 32 (mEq/L)     Glucose, Bld  109 (*)  70 - 99 (mg/dL)     BUN  11  6 - 23 (mg/dL)     Creatinine, Ser  1.61  0.50 - 1.10 (mg/dL)     Calcium  9.1  8.4 - 10.5 (mg/dL)     GFR calc non Af Amer  >90  >90 (mL/min)     GFR calc Af Amer  >90  >90 (mL/min)     [name] Contrast  08/12/2011 *RADIOLOGY  REPORT* Clinical data: Multiple sclerosis. No muscular decline. MRI HEAD WITHOUT AND WITH CONTRAST Technique: Multiplanar, multiecho pulse sequences of the brain and surrounding structures were obtained without and with intravenous contrast. Contrast: 13mL MULTIHANCE GADOBENATE DIMEGLUMINE 529 MG/ML IV SOLN Comparison: Head CT 08/11/2011 Findings: There is abnormal FLAIR and T2 signal throughout the brain in a pattern consistent with advanced chronic multiple sclerosis. There is involvement are seen within the pons, middle cerebellar peduncles, hemispheric white matter, thalami, hemispheric deep white matter and hemispheric subcortical white matter. There is extensive involvement of the corpus callosum. There are a few areas of cortical involvement. None of these shows restricted diffusion. There is only a single focus in the right frontal lobe that shows any contrast enhancement. No evidence of mass lesion, hemorrhage, hydrocephalus or extra-axial collection. No calvarial abnormality. Sinuses, middle ears and mastoids are clear. IMPRESSION: Widespread chronic appearing white matter disease with a pattern typical of chronic multiple sclerosis. No lesions show restricted diffusion. A single lesion in the right frontal lobe shows mild contrast enhancement. MRI CERVICAL SPINE WITHOUT AND WITH CONTRAST Technique: Multiplanar and multiecho pulse sequences of the cervic al spine, to include the craniocervical junction and cervicothoraci c junction, were obtained without and with intravenous contrast. Findings: There is no significant degenerative disease in the cervical region. No spinal stenosis. No foraminal stenosis. No osseous or articular pathology. There are numerous areas of abnormal T2 signal within the cervical spinal cord consistent with multiple sclerosis involvement of the cervical spine. None of these shows contrast enhancement. No evidence of cord hemorrhage. No evidence of syrinx or cord atrophy.  IMPRESSION: Multifocal T2 abnormality throughout the cervical spinal cord consistent with multiple sclerosis involvement of the cord. Original Report Authenticated By: Thomasenia Sales, M.D.  Mr Cervical Spine W Wo Contrast  08/12/2011 *RADIOLOGY REPORT* Clinical data: Multiple sclerosis. No muscular decline. MRI HEAD WITHOUT AND WITH CONTRAST Technique: Multiplanar, multiecho pulse sequences of the brain and surrounding structures were obtained without and with intravenous contrast. Contrast: 13mL MULTIHANCE GADOBENATE DIMEGLUMINE 529 MG/ML IV SOLN Comparison: Head CT 08/11/2011 Findings: There is abnormal FLAIR and T2 signal throughout the brain in a pattern consistent with advanced chronic multiple sclerosis. There is involvement are seen within the pons, middle cerebellar peduncles, hemispheric white matter, thalami, hemispheric deep white matter and hemispheric subcortical white matter. There is extensive involvement of the corpus callosum. There are a few areas of cortical involvement. None of these shows restricted diffusion. There is only a  single focus in the right frontal lobe that shows any contrast enhancement. No evidence of mass lesion, hemorrhage, hydrocephalus or extra-axial collection. No calvarial abnormality. Sinuses, middle ears and mastoids are clear. IMPRESSION: Widespread chronic appearing white matter disease with a pattern typical of chronic multiple sclerosis. No lesions show restricted diffusion. A single lesion in the right frontal lobe shows mild contrast enhancement. MRI CERVICAL SPINE WITHOUT AND WITH CONTRAST Technique: Multiplanar and multiecho pulse sequences of the cervic al spine, to include the craniocervical junction and cervicothoraci c junction, were obtained without and with intravenous contrast. Findings: There is no significant degenerative disease in the cervical region. No spinal stenosis. No foraminal stenosis. No osseous or articular pathology. There are numerous areas  of abnormal T2 signal within the cervical spinal cord consistent with multiple sclerosis involvement of the cervical spine. None of these shows contrast enhancement. No evidence of cord hemorrhage. No evidence of syrinx or cord atrophy. IMPRESSION: Multifocal T2 abnormality throughout the cervical spinal cord consistent with multiple sclerosis involvement of the cord. Original Report Authenticated By: Thomasenia Sales, M.D.   Post Admission Physician Evaluation:  1. Functional deficits secondary to MS exacerbation. 2. Patient is admitted to receive collaborative, interdisciplinary care between the physiatrist, rehab nursing staff, and therapy team. 3. Patient's level of medical complexity and substantial therapy needs in context of that medical necessity cannot be provided at a lesser intensity of care such as a SNF. 4. Patient has experienced substantial functional loss from his/her baseline which was documented above under the "Functional History" and "Functional Status" headings. Judging by the patient's diagnosis, physical exam, and functional history, the patient has potential for functional progress which will result in measurable gains while on inpatient rehab. These gains will be of substantial and practical use upon discharge in facilitating mobility and self-care at the household level. 5. Physiatrist will provide 24 hour management of medical needs as well as oversight of the therapy plan/treatment and provide guidance as appropriate regarding the interaction of the two. 6. 24 hour rehab nursing will assist with bladder management, bowel management, safety, skin/wound care, disease management, medication administration, pain management and patient education and help integrate therapy concepts, techniques,education, etc. 7. PT will assess and treat for: Neuromuscular reeducation, coordination, balance, functional mobility, adaptive equipment and safety. Goals are: Modified independent to  supervision. 8. OT will assess and treat for: Upper extremity strength, range of motion, ADLs, neuromuscular reeducation, adaptive equipment, safety. Goals are: Modified independent to supervision. 9. SLP will assess and treat for: Communication and cognition. Goals are: Modified independent to supervision. 10. Case Management and Social Worker will assess and treat for psychological issues and discharge planning. 11. Team conference will be held weekly to assess progress toward goals and to determine barriers to discharge. 12. Patient will receive at least 3 hours of therapy per day at least 5 days per week. 13. ELOS and Prognosis: One week excellent Medical Problem List and Plan:  1. MS exacerbation. Continue Solu-Medrol as per neurology services- patient has responded to this quite nicely 2. DVT Prophylaxis/Anticoagulation: Subcutaneous Lovenox. Monitor for any signs of DVT  3. UDS positive for marijuana. Provide counseling  Ranelle Oyster M.D. 08/14/2011

## 2011-08-14 NOTE — PMR Pre-admission (Signed)
PMR Admission Coordinator Pre-Admission Assessment  Patient: Jeanette Estrada is an 36 y.o., female MRN: [id number] DOB: [birth year] Height: 5\' 9"  (175.3 cm) Weight: 63.7 kg (140 lb 6.9 oz)  Insurance Information Self pay-  Medicaid Application Date: Patient has appt on Tuesday 6/4 in hospital  Case Manager:  Disability Application Date: been denied twice      Case Worker:   Emergency Contact Information Contact Information    Name Relation Home Work Mobile   Williamson-Livingston,Deena Sister [phone]     [place], [place] [phone]       Current Medical History  Patient Admitting Diagnosis: MS exacerbation History of Present Illness: 36 year old right-handed female admitted 08/12/2011 with progressive weakness in her extremities of the past few years with with excessive tremulousness. She states that she had never had a formal workup for multiple sclerosis. Patient on no formal medications prior to admission. She states she can barely walk at times and uses a cane as well as some slurred speech. MRI of the brain showed widespread chronic appearing white matter disease with a pattern typical of chronic multiple sclerosis. MRI cervical spine with numerous areas of abnormal T2 signal within the cervical spinal cord consistent with multiple sclerosis involvement of the cervical spine. Neurology consulted placed on high dose intravenous Solu-Medrol x5 days for MS exacerbation. Urine drug screen on admission was positive for marijuana. Subcutaneous Lovenox added for DVT prophylaxis. Physical and occupational therapy ongoing with recommendations for physical medicine rehabilitation consult to consider inpatient rehabilitation services     Total: 2     Past Medical History  Past Medical History  Diagnosis Date  . Neuromuscular disease     Family History  family history includes Multiple sclerosis in her maternal grandmother.  Prior Rehab/Hospitalizations: no prior CIR stays  Current  Medications  Current facility-administered medications:acetaminophen (TYLENOL) suppository 650 mg, 650 mg, Rectal, Q6H PRN, Gery Pray, MD;  acetaminophen (TYLENOL) tablet 650 mg, 650 mg, Oral, Q6H PRN, Gery Pray, MD, 650 mg at 08/13/11 2200;  enoxaparin (LOVENOX) injection 40 mg, 40 mg, Subcutaneous, Q24H, Debby Crosley, MD, 40 mg at 08/14/11 1327 methylPREDNISolone sodium succinate (SOLU-MEDROL) 1,000 mg in sodium chloride 0.9 % 50 mL IVPB, 1,000 mg, Intravenous, Q24H, Cathlyn Parsons, PA-C, 1,000 mg at 08/14/11 0003;  ondansetron (ZOFRAN) injection 4 mg, 4 mg, Intravenous, Q6H PRN, Debby Crosley, MD;  ondansetron (ZOFRAN) tablet 4 mg, 4 mg, Oral, Q6H PRN, Debby Crosley, MD;  pantoprazole (PROTONIX) EC tablet 40 mg, 40 mg, Oral, Q1200, Cathlyn Parsons, PA-C, 40 mg at 08/14/11 1228 DISCONTD: 0.9 % NaCl with KCl 20 mEq/ L  infusion, , Intravenous, Continuous, Debby Crosley, MD, Last Rate: 75 mL/hr at 08/13/11 2244  Patients Current Diet: General  Precautions / Restrictions Precautions Precautions: Fall Restrictions Weight Bearing Restrictions: No   Prior Activity Level Community (5-7x/wk): active, 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, newspaper / Equipment Home Assistive Devices/Equipment: Medical laboratory scientific officer (specify quad or straight) Home Adaptive Equipment: Straight cane;Hand-held shower hose  Prior Functional Level Prior Function Level of Independence: Needs assistance Needs Assistance: Gait Gait Assistance: needed assistance to ambulate Able to Take Stairs?: Yes Driving: Yes Vocation: Unemployed Comments: uses cane in the home and for longer distances, uses motorized scooter in stores, frequent falls  Current Functional Level Cognition  Arousal/Alertness: Awake/alert Overall Cognitive Status: Appears within functional limits for tasks assessed/performed Orientation Level: Oriented X4 Cognition - Other Comments: speech is slow and purposeful, but  intelligible    Extremity  Assessment (includes Sensation/Coordination)  RUE ROM/Strength/Tone: Deficits RUE ROM/Strength/Tone Deficits: 3+/5 throughout.   RUE Sensation: Deficits RUE Sensation Deficits: When asked to perform certain movements with UE, pt required increased time and reports that she sometimes needs to look at her arm to be sure of where it is.  RUE Coordination: Deficits RUE Coordination Deficits: Requires increased time.  Slightly tremorous during gross/fine motor activites (opening containers, applying makeup).   RLE ROM/Strength/Tone: Deficits RLE ROM/Strength/Tone Deficits: grossly 3/5 for knee flexion/extenstion and hip flexion.  3-/5 for ankle dorsiflexion RLE Coordination: Deficits RLE Coordination Deficits: slightly ataxic with movements    ADLs  Eating/Feeding: Performed;Modified independent Where Assessed - Eating/Feeding: Edge of bed Grooming: Performed;Wash/dry hands;Supervision/safety Where Assessed - Grooming: Unsupported standing Upper Body Bathing: Simulated;Set up Where Assessed - Upper Body Bathing: Unsupported sitting Lower Body Bathing: Simulated;Minimal assistance Where Assessed - Lower Body Bathing: Supported sit to stand Upper Body Dressing: Performed;Minimal assistance Where Assessed - Upper Body Dressing: Unsupported sitting Lower Body Dressing: Performed;Min guard Where Assessed - Lower Body Dressing: Sopported sit to stand Toilet Transfer: Performed;Minimal assistance Toilet Transfer Method:  (ambulating) Acupuncturist: Regular height toilet Toileting - Clothing Manipulation and Hygiene: Performed;Min guard Where Assessed - Engineer, mining and Hygiene: Sit to stand from 3-in-1 or toilet Equipment Used: Rolling walker;Back brace Transfers/Ambulation Related to ADLs: Min assist with RW and mod verbal cueing for safety with RW. ADL Comments: Pt performed UB/LB dressing with cueing for energy conservation  techniques.  Educated pt to thread LEs through both undergarment and pants and stand 1x to pull both articles of clothing up over hips simutaneously.  Edcuated pt on safe use of RW when transporting items, such as draping clothing over front of RW.     Mobility  Bed Mobility: Not assessed (Pt sitting EOB upon OT arrival) Supine to Sit: 6: Modified independent (Device/Increase time) Sit to Supine: 6: Modified independent (Device/Increase time)    Transfers  Transfers: Sit to Stand;Stand to Sit Sit to Stand: 4: Min guard;From bed;From toilet;With upper extremity assist Stand to Sit: To bed;To chair/3-in-1;To toilet;With armrests;With upper extremity assist;4: Min guard    Ambulation / Gait / Stairs / Psychologist, prison and probation services  Ambulation/Gait Ambulation/Gait Assistance: 4: Min Environmental consultant (Feet): 120 Feet Assistive device: Rolling walker Ambulation/Gait Assistance Details: Patient with tremors throughout ambulation, requiring steadying for balalnce. Patient L LE externally rotated and L knee hyperextended. Patient stated that this has been an issue for a while Gait Pattern: Step-through pattern;Decreased dorsiflexion - right;Decreased dorsiflexion - left;Decreased stride length Stairs: No Wheelchair Mobility Wheelchair Mobility: No    Posture / Balance Static Sitting Balance Static Sitting - Balance Support: No upper extremity supported Static Sitting - Level of Assistance: 6: Modified independent (Device/Increase time) Static Sitting - Comment/# of Minutes: 15 min Static Standing Balance Static Standing - Balance Support: No upper extremity supported;During functional activity Static Standing - Level of Assistance: 5: Stand by assistance Static Standing - Comment/# of Minutes: pt able to stand with hips leaned against sink and stand with Supervision/Min guard A for hand hygiene. pt still with tremors Dynamic Standing Balance Dynamic Standing - Balance Support: Bilateral upper  extremity supported Dynamic Standing - Level of Assistance: 4: Min assist Dynamic Standing - Balance Activities: Lateral lean/weight shifting     Previous Home Environment Living Arrangements: Children;Other relatives (lives with her sister, brother, aunt and 6 children) Lives With: Family (lives with sister and 6 children) Available Help at Discharge: Family;Available PRN/intermittently Type of Home: House  Home Layout: Two level (pt lives in basement, kitchen upstairs) Alternate Level Stairs-Rails: Right Alternate Level Stairs-Number of Steps: flight Home Access: Stairs to enter Secretary/administrator of Steps: 2 Bathroom Shower/Tub: Forensic scientist: Standard Bathroom Accessibility: Yes How Accessible: Accessible via walker Home Care Services: No  Discharge Living Setting Plans for Discharge Living Setting: House;Lives with (comment) (family) Type of Home at Discharge: House Discharge Home Layout: Multi-level (patient lives in basement) Alternate Level Stairs-Rails:  (flight) Alternate Level Stairs-Number of Steps: full flight Discharge Home Access: Stairs to enter Secretary/administrator of Steps: 2 Discharge Bathroom Shower/Tub: Tub/shower unit;Curtain Discharge Bathroom Toilet: Standard Discharge Bathroom Accessibility: Yes How Accessible: Accessible via walker Do you have any problems obtaining your medications?: Yes (Describe) (needs help refilling meds)  Social/Family/Support Systems Patient Roles: Parent (sibling) Contact Information: [phone] Anticipated Caregiver: sister and family Anticipated Caregiver's Contact Information: [phone] Ability/Limitations of Caregiver: works, but sister says she is quitting job soon Medical laboratory scientific officer: Intermittent Discharge Plan Discussed with Primary Caregiver: Yes Is Caregiver In Agreement with Plan?: Yes Does Caregiver/Family have Issues with Lodging/Transportation while Pt is in Rehab?:  No  Goals/Additional Needs Patient/Family Goal for Rehab: Min A PT, S-min A OT, S-Mod I SLP Expected length of stay: 2 weeks Cultural Considerations: none Equipment Needs: to be determined Pt/Family Agrees to Admission and willing to participate: Yes Program Orientation Provided & Reviewed with Pt/Caregiver Including Roles  & Responsibilities: Yes Additional Information Needs: Patient has appt with medicaid for application Tuesday June 4th  Patient Condition: This patient's condition remains as documented in the Consult dated 08/13/11 1642 , in which the Rehabilitation Physician determined and documented that the patient's condition is appropriate for intensive rehabilitative care in an inpatient rehabilitation facility.  Preadmission Screen Completed By:  Oletta Darter, 08/14/2011 1:36 PM ______________________________________________________________________   Discussed status with Dr. Riley Kill on5/29/13 at 1342 and received telephone approval for admission today.  Admission Coordinator:  Oletta Darter, time1342/Date5/29/13

## 2011-08-14 NOTE — Progress Notes (Signed)
Physical Therapy Treatment Patient Details Name: Jeanette Estrada MRN: [id number] DOB: 1975-04-21 Today's Date: 08/14/2011 Time: 5284-1324 PT Time Calculation (min): 24 min  PT Assessment / Plan / Recommendation Comments on Treatment Session  Patient with new DX of MS. Patient reading articles about MS to expand her knowledge. Patient very motivated. States that she takes care of household work at home and that during the day she is by herself but her aunt is in and out. Contimue to recommend rehab to increase incependence prior to DC    Follow Up Recommendations  Inpatient Rehab    Barriers to Discharge        Equipment Recommendations  Defer to next venue    Recommendations for Other Services Rehab consult  Frequency Min 4X/week   Plan      Precautions / Restrictions Precautions Precautions: Fall   Pertinent Vitals/Pain Denied pain     Mobility  Bed Mobility Bed Mobility: Sit to Supine Supine to Sit: 6: Modified independent (Device/Increase time) Sit to Supine: 6: Modified independent (Device/Increase time) Transfers Sit to Stand: 4: Min guard;From bed;With upper extremity assist Stand to Sit: To bed;With upper extremity assist;4: Min guard Details for Transfer Assistance: Patient shaking however did not require physical assistance Ambulation/Gait Ambulation/Gait Assistance: 4: Min assist Ambulation Distance (Feet): 120 Feet Assistive device: Rolling walker Ambulation/Gait Assistance Details: Patient with tremors throughout ambulation, requiring steadying for balalnce. Patient L LE externally rotated and L knee hyperextended. Patient stated that this has been an issue for a while Gait Pattern: Step-through pattern;Decreased dorsiflexion - right;Decreased dorsiflexion - left;Decreased stride length    Exercises     PT Diagnosis:    PT Problem List:   PT Treatment Interventions:     PT Goals Acute Rehab PT Goals PT Goal: Sit to Stand - Progress: Progressing toward  goal PT Goal: Stand to Sit - Progress: Progressing toward goal PT Goal: Stand - Progress: Progressing toward goal PT Goal: Ambulate - Progress: Progressing toward goal  Visit Information  Last PT Received On: 08/14/11 Assistance Needed: +1    Subjective Data      Cognition  Overall Cognitive Status: Appears within functional limits for tasks assessed/performed Arousal/Alertness: Awake/alert Orientation Level: Appears intact for tasks assessed Behavior During Session: [facility] for tasks performed    Balance  Dynamic Standing Balance Dynamic Standing - Balance Support: Bilateral upper extremity supported Dynamic Standing - Level of Assistance: 4: Min assist Dynamic Standing - Balance Activities: Lateral lean/weight shifting  End of Session PT - End of Session Equipment Utilized During Treatment: Gait belt Activity Tolerance: Patient tolerated treatment well Patient left: in bed;with call bell/phone within reach Nurse Communication: Mobility status    Fredrich Birks 08/14/2011, 10:18 AM 08/14/2011 Fredrich Birks PTA [phone] pager [phone] office

## 2011-08-14 NOTE — Clinical Social Work Note (Signed)
Pt discharged to CIR today prior to returning home. CSW is signing off as no further needs identified.   Dede Query, MSW, Theresia Majors [phone]

## 2011-08-14 NOTE — Progress Notes (Signed)
Jeanette Estrada is a pleasant 36 y.o. female admitted with increasing weakness and clumsiness. She has a picture highly suggestive of MS. I have reviewed her chart seen and examined her at bedside. Appreciate neurology/CIR.  SUBJECTIVE She feels she is close to 90% of her baseline.   1. Altered mental status     Past Medical History  Diagnosis Date  . Neuromuscular disease    Current Facility-Administered Medications  Medication Dose Route Frequency Provider Last Rate Last Dose  . 0.9 % NaCl with KCl 20 mEq/ L  infusion   Intravenous Continuous Debby Crosley, MD 75 mL/hr at 08/13/11 2244    . acetaminophen (TYLENOL) tablet 650 mg  650 mg Oral Q6H PRN Debby Crosley, MD   650 mg at 08/13/11 2200   Or  . acetaminophen (TYLENOL) suppository 650 mg  650 mg Rectal Q6H PRN Debby Crosley, MD      . enoxaparin (LOVENOX) injection 40 mg  40 mg Subcutaneous Q24H Debby Crosley, MD   40 mg at 08/13/11 1500  . methylPREDNISolone sodium succinate (SOLU-MEDROL) 1,000 mg in sodium chloride 0.9 % 50 mL IVPB  1,000 mg Intravenous Q24H Cathlyn Parsons, PA-C   1,000 mg at 08/14/11 0003  . ondansetron (ZOFRAN) tablet 4 mg  4 mg Oral Q6H PRN Debby Crosley, MD       Or  . ondansetron (ZOFRAN) injection 4 mg  4 mg Intravenous Q6H PRN Debby Crosley, MD      . pantoprazole (PROTONIX) EC tablet 40 mg  40 mg Oral Q1200 Cathlyn Parsons, PA-C   40 mg at 08/13/11 1201   No Known Allergies Principal Problem:  *Neuromuscular disease   Vital signs in last 24 hours: Temp:  [97.6 F (36.4 C)-98.3 F (36.8 C)] 98.3 F (36.8 C) (05/29 1020) Pulse Rate:  [72-86] 85  (05/29 1020) Resp:  [18-19] 18  (05/29 1020) BP: (91-110)/(62-73) 109/71 mmHg (05/29 1020) SpO2:  [100 %] 100 % (05/29 1020) Weight change:  Last BM Date: 08/09/11  Intake/Output from previous day:   Intake/Output this shift:    Lab Results:  Basename 08/12/11 0330 08/11/11 2200  WBC 7.9 12.7*  HGB 10.9* 13.7  HCT 32.0* 41.9  PLT 212 243    BMET  Basename 08/14/11 0545 08/13/11 0534  NA 141 140  K 4.1 3.8  CL 110 108  CO2 22 22  GLUCOSE 109* 131*  BUN 11 8  CREATININE 0.68 0.62  CALCIUM 9.1 9.1    Studies/Results: Mr Jeanette Estrada Contrast  08/12/2011  *RADIOLOGY REPORT*  Clinical data:  Multiple sclerosis.  No muscular decline.  MRI HEAD WITHOUT AND WITH CONTRAST  Technique:  Multiplanar, multiecho pulse sequences of the brain and surrounding structures were obtained without and with intravenous contrast.  Contrast: 13mL MULTIHANCE GADOBENATE DIMEGLUMINE 529 MG/ML IV SOLN  Comparison:  Head CT 08/11/2011  Findings:  There is abnormal FLAIR and T2 signal throughout the brain in a pattern consistent with advanced chronic multiple sclerosis.  There is involvement are seen within the pons, middle cerebellar peduncles, hemispheric white matter, thalami, hemispheric deep white matter and hemispheric subcortical white matter.  There is extensive involvement of the corpus callosum. There are a few areas of cortical involvement.  None of these shows restricted diffusion.  There is only a single focus in the right frontal lobe that shows any contrast enhancement.  No evidence of mass lesion, hemorrhage, hydrocephalus or extra-axial collection. No calvarial abnormality.  Sinuses, middle ears and mastoids are  clear.  IMPRESSION: Widespread chronic appearing white matter disease with a pattern typical of chronic multiple sclerosis.  No lesions show restricted diffusion.  A single lesion in the right frontal lobe shows mild contrast enhancement.  MRI CERVICAL SPINE WITHOUT AND WITH CONTRAST  Technique:  Multiplanar and multiecho pulse sequences of the cervic al spine, to include the craniocervical junction and cervicothoraci c junction, were obtained without and with intravenous contrast.  Findings:  There is no significant degenerative disease in the cervical region.  No spinal stenosis.  No foraminal stenosis.  No osseous or articular pathology.   There are numerous areas of abnormal T2 signal within the cervical spinal cord consistent with multiple sclerosis involvement of the cervical spine.  None of these shows contrast enhancement.  No evidence of cord hemorrhage.  No evidence of syrinx or cord atrophy.  IMPRESSION: Multifocal T2 abnormality throughout the cervical spinal cord consistent with multiple sclerosis involvement of the cord.  Original Report Authenticated By: Thomasenia Sales, M.D.   Mr Cervical Spine W Estrada Contrast  08/12/2011  *RADIOLOGY REPORT*  Clinical data:  Multiple sclerosis.  No muscular decline.  MRI HEAD WITHOUT AND WITH CONTRAST  Technique:  Multiplanar, multiecho pulse sequences of the brain and surrounding structures were obtained without and with intravenous contrast.  Contrast: 13mL MULTIHANCE GADOBENATE DIMEGLUMINE 529 MG/ML IV SOLN  Comparison:  Head CT 08/11/2011  Findings:  There is abnormal FLAIR and T2 signal throughout the brain in a pattern consistent with advanced chronic multiple sclerosis.  There is involvement are seen within the pons, middle cerebellar peduncles, hemispheric white matter, thalami, hemispheric deep white matter and hemispheric subcortical white matter.  There is extensive involvement of the corpus callosum. There are a few areas of cortical involvement.  None of these shows restricted diffusion.  There is only a single focus in the right frontal lobe that shows any contrast enhancement.  No evidence of mass lesion, hemorrhage, hydrocephalus or extra-axial collection. No calvarial abnormality.  Sinuses, middle ears and mastoids are clear.  IMPRESSION: Widespread chronic appearing white matter disease with a pattern typical of chronic multiple sclerosis.  No lesions show restricted diffusion.  A single lesion in the right frontal lobe shows mild contrast enhancement.  MRI CERVICAL SPINE WITHOUT AND WITH CONTRAST  Technique:  Multiplanar and multiecho pulse sequences of the cervic al spine, to include  the craniocervical junction and cervicothoraci c junction, were obtained without and with intravenous contrast.  Findings:  There is no significant degenerative disease in the cervical region.  No spinal stenosis.  No foraminal stenosis.  No osseous or articular pathology.  There are numerous areas of abnormal T2 signal within the cervical spinal cord consistent with multiple sclerosis involvement of the cervical spine.  None of these shows contrast enhancement.  No evidence of cord hemorrhage.  No evidence of syrinx or cord atrophy.  IMPRESSION: Multifocal T2 abnormality throughout the cervical spinal cord consistent with multiple sclerosis involvement of the cord.  Original Report Authenticated By: Thomasenia Sales, M.D.    Medications: I have reviewed the patient's current medications.   Physical exam GENERAL- alert HEAD- normal atraumatic, no neck masses, normal thyroid, no jvd RESPIRATORY- appears well, vitals normal, no respiratory distress, acyanotic, normal RR, ear and throat exam is normal, neck free of mass or lymphadenopathy, chest clear, no wheezing, crepitations, rhonchi, normal symmetric air entry CVS- regular rate and rhythm, S1, S2 normal, no murmur, click, rub or gallop ABDOMEN- abdomen is soft without significant tenderness,  masses, organomegaly or guarding NEURO- Grossly normal EXTREMITIES- extremities normal, atraumatic, no cyanosis or edema  Plan   * Neuromuscular disease-MS- continue steroids per neurology. Transfer to CIR once arrangements finalized, otherwise continue PT/OT. Appreciate input. * dvt prophylaxis.    Maddy Graham 08/14/2011 11:17 AM Pager: [phone].

## 2011-08-14 NOTE — Care Management Note (Signed)
    Page 1 of 1   08/15/2011     8:52:30 AM   CARE MANAGEMENT NOTE 08/15/2011  Patient:  Jeanette Estrada, Jeanette Estrada   Account Number:  [id number]  Date Initiated:  08/14/2011  Documentation initiated by:  [facility]  Subjective/Objective Assessment:   Admitted with ataxia, slurred speech.  Lives with sister, her 3 children.     Action/Plan:   PT eval-recommending inpt rehab  OT eval-recommending inpt rehab   Anticipated DC Date:  08/16/2011   Anticipated DC Plan:  IP REHAB FACILITY  In-house referral  Financial Counselor      DC Planning Services  CM consult      PAC Choice  IP REHAB   Choice offered to / List presented to:             Status of service:  Completed, signed off Medicare Important Message given?   (If response is "NO", the following Medicare IM given date fields will be blank) Date Medicare IM given:   Date Additional Medicare IM given:    Discharge Disposition:  IP REHAB FACILITY  Per UR Regulation:  Reviewed for med. necessity/level of care/duration of stay  If discussed at Long Length of Stay Meetings, dates discussed:    Comments:  08/14/11 Spoke with patient on 08/13/11 about d/c plans. CIR evaluating her. She does not have a PCP, gave her information on [facility]. She has been there previously.Gave her information on [employer] and [place] chapter. Patient has been reading about MS and states she will f/u with MS Society. Gave patient discount pharmacy card. With patient's permission checked with [facility] to see if they are taking new patients but they are not.Will continue to follow for d/c needs.  Jacquelynn Cree RN, BSN, CCM

## 2011-08-14 NOTE — Progress Notes (Signed)
Subjective:  Patient says that she feels better, muscles feel stronger. Speech is stronger. Says that she is using walker. We discussed that she needed to stay through her IV steroids. She is reading articles on MS because she wants to learn about her disease.  Objective: Current vital signs: BP 110/73  Pulse 76  Temp(Src) 98 F (36.7 C) (Oral)  Resp 18  Ht 5\' 9"  (1.753 m)  Wt 63.7 kg (140 lb 6.9 oz)  BMI 20.74 kg/m2  SpO2 100% Vital signs in last 24 hours: Temp:  [97.6 F (36.4 C)-98.3 F (36.8 C)] 98 F (36.7 C) (05/29 0600) Pulse Rate:  [72-86] 76  (05/29 0600) Resp:  [18-19] 18  (05/29 0600) BP: (91-110)/(62-73) 110/73 mmHg (05/29 0600) SpO2:  [100 %] 100 % (05/29 0600)  Neurologic Exam: Mental Status: Alert, oriented, thought content appropriate.  Speech fluent without evidence of aphasia.  Able to follow 3 step commands without difficulty. Cranial Nerves: II: visual fields grossly normal, pupils equal, round, reactive to light and accommodation III,IV, VI: ptosis not present, extra-ocular motions intact bilaterally V,VII: smile symmetric, facial light touch sensation normal bilaterally VIII: hearing normal bilaterally IX,X: gag reflex present XI: trapezius strength/neck flexion strength normal bilaterally XII: tongue strength normal  Motor: Right : Upper extremity   5/5    Left:     Upper extremity   5/5  Lower extremity   5/5     Lower extremity   5/5 Tone and bulk:normal tone throughout; no atrophy noted Sensory: Pinprick and light touch intact throughout, bilaterally Deep Tendon Reflexes: 2+ and symmetric throughout Plantars: Right: downgoing   Left: downgoing Cerebellar: normal finger-to-nose, normal rapid alternating movements and normal heel-to-shin test normal gait and station    Lab Results: CBC:  Lab 08/12/11 0330 08/11/11 2200  WBC 7.9 12.7*  NEUTROABS -- 7.7  HGB 10.9* 13.7  HCT 32.0* 41.9  MCV 87.9 89.5  PLT 212 243   Basic Metabolic  Panel:  Lab 08/14/11 0545 08/13/11 0534  NA 141 140  K 4.1 3.8  CL 110 108  CO2 22 22  GLUCOSE 109* 131*  BUN 11 8  CREATININE 0.68 0.62  CALCIUM 9.1 9.1  MG -- --  PHOS -- --   Liver Function Tests:  Lab 08/12/11 0330 08/11/11 2200  AST 14 20  ALT 10 16  ALKPHOS 59 85  BILITOT 0.4 0.3  PROT 5.7* 8.2  ALBUMIN 3.2* 4.9   Mr Laqueta Jean ZO Contrast  08/12/2011  *RADIOLOGY REPORT*  Clinical data:  Multiple sclerosis.  No muscular decline.  MRI HEAD WITHOUT AND WITH CONTRAST  Technique:  Multiplanar, multiecho pulse sequences of the brain and surrounding structures were obtained without and with intravenous contrast.  Contrast: 13mL MULTIHANCE GADOBENATE DIMEGLUMINE 529 MG/ML IV SOLN  Comparison:  Head CT 08/11/2011  Findings:  There is abnormal FLAIR and T2 signal throughout the brain in a pattern consistent with advanced chronic multiple sclerosis.  There is involvement are seen within the pons, middle cerebellar peduncles, hemispheric white matter, thalami, hemispheric deep white matter and hemispheric subcortical white matter.  There is extensive involvement of the corpus callosum. There are a few areas of cortical involvement.  None of these shows restricted diffusion.  There is only a single focus in the right frontal lobe that shows any contrast enhancement.  No evidence of mass lesion, hemorrhage, hydrocephalus or extra-axial collection. No calvarial abnormality.  Sinuses, middle ears and mastoids are clear.  IMPRESSION: Widespread chronic appearing white  matter disease with a pattern typical of chronic multiple sclerosis.  No lesions show restricted diffusion.  A single lesion in the right frontal lobe shows mild contrast enhancement.  MRI CERVICAL SPINE WITHOUT AND WITH CONTRAST  Technique:  Multiplanar and multiecho pulse sequences of the cervic al spine, to include the craniocervical junction and cervicothoraci c junction, were obtained without and with intravenous contrast.  Findings:   There is no significant degenerative disease in the cervical region.  No spinal stenosis.  No foraminal stenosis.  No osseous or articular pathology.  There are numerous areas of abnormal T2 signal within the cervical spinal cord consistent with multiple sclerosis involvement of the cervical spine.  None of these shows contrast enhancement.  No evidence of cord hemorrhage.  No evidence of syrinx or cord atrophy.  IMPRESSION: Multifocal T2 abnormality throughout the cervical spinal cord consistent with multiple sclerosis involvement of the cord.  Original Report Authenticated By: Thomasenia Sales, M.D.   Mr Cervical Spine W Wo Contrast  08/12/2011  *RADIOLOGY REPORT*  Clinical data:  Multiple sclerosis.  No muscular decline.  MRI HEAD WITHOUT AND WITH CONTRAST  Technique:  Multiplanar, multiecho pulse sequences of the brain and surrounding structures were obtained without and with intravenous contrast.  Contrast: 13mL MULTIHANCE GADOBENATE DIMEGLUMINE 529 MG/ML IV SOLN  Comparison:  Head CT 08/11/2011  Findings:  There is abnormal FLAIR and T2 signal throughout the brain in a pattern consistent with advanced chronic multiple sclerosis.  There is involvement are seen within the pons, middle cerebellar peduncles, hemispheric white matter, thalami, hemispheric deep white matter and hemispheric subcortical white matter.  There is extensive involvement of the corpus callosum. There are a few areas of cortical involvement.  None of these shows restricted diffusion.  There is only a single focus in the right frontal lobe that shows any contrast enhancement.  No evidence of mass lesion, hemorrhage, hydrocephalus or extra-axial collection. No calvarial abnormality.  Sinuses, middle ears and mastoids are clear.  IMPRESSION: Widespread chronic appearing white matter disease with a pattern typical of chronic multiple sclerosis.  No lesions show restricted diffusion.  A single lesion in the right frontal lobe shows mild contrast  enhancement.  MRI CERVICAL SPINE WITHOUT AND WITH CONTRAST  Technique:  Multiplanar and multiecho pulse sequences of the cervic al spine, to include the craniocervical junction and cervicothoraci c junction, were obtained without and with intravenous contrast.  Findings:  There is no significant degenerative disease in the cervical region.  No spinal stenosis.  No foraminal stenosis.  No osseous or articular pathology.  There are numerous areas of abnormal T2 signal within the cervical spinal cord consistent with multiple sclerosis involvement of the cervical spine.  None of these shows contrast enhancement.  No evidence of cord hemorrhage.  No evidence of syrinx or cord atrophy.  IMPRESSION: Multifocal T2 abnormality throughout the cervical spinal cord consistent with multiple sclerosis involvement of the cord.  Original Report Authenticated By: Thomasenia Sales, M.D.    Medications:  Scheduled:   . enoxaparin  40 mg Subcutaneous Q24H  . methylPREDNISolone (SOLU-MEDROL) injection  1,000 mg Intravenous Q24H  . pantoprazole  40 mg Oral Q1200    Assessment/Plan:   36 yo patient with new dx of multiple sclerosis   -Day #3 out of 5, IV methylprednolisone  -Social Work Support for financial/medical needs post hospitalization is in progress  -PT/OT working with patient and will evaluate for inpatient vs home health visits -Discussed in detail with the  patient and inpatient and outpatient course of continued lifelong care.     LOS: 3 days   @LBSIGN @ 08/14/2011  8:53 AM

## 2011-08-14 NOTE — Discharge Summary (Signed)
DISCHARGE Jeanette Estrada  MR#: [id number]  DOB:March 22, 1975  Date of Admission: 08/11/2011 Date of Discharge: 08/14/2011  Attending Physician:Jamilee Lafosse  Patient's QMV:HQIONG,EXBMWUX, MD, MD  Consults:Treatment Team:  Kym Groom, MD  Discharge Diagnoses: Present on Admission:  .Multiple sclerosis   Hospital Course: [name] was admitted with increasing weakness and clumsiness,w ith clinical evaluation(including MRI brain showing widespread chronic appearing white matter disease with a pattern typical of MS), strongly suggesting multiple sclerosis. Her symptoms had been progressive over a number of years. She was seen by neurology, who helped with her care. She is responding to high dose steroids, and should follow with Neurology outpatient for further management. She has been accepted to CIR where she will be transferred today. Appreciate CIR help.   Medication List  As of 08/14/2011  2:05 PM       Day of Discharge BP 109/71  Pulse 85  Temp(Src) 98.3 F (36.8 C) (Oral)  Resp 18  Ht 5\' 9"  (1.753 m)  Wt 63.7 kg (140 lb 6.9 oz)  BMI 20.74 kg/m2  SpO2 100%  Physical Exam: Sitting up, not in distress.  Results for orders placed during the hospital encounter of 08/11/11 (from the past 24 hour(s))  BASIC METABOLIC PANEL     Status: Abnormal   Collection Time   08/14/11  5:45 AM      Component Value Range   Sodium 141  135 - 145 (mEq/L)   Potassium 4.1  3.5 - 5.1 (mEq/L)   Chloride 110  96 - 112 (mEq/L)   CO2 22  19 - 32 (mEq/L)   Glucose, Bld 109 (*) 70 - 99 (mg/dL)   BUN 11  6 - 23 (mg/dL)   Creatinine, Ser 3.24  0.50 - 1.10 (mg/dL)   Calcium 9.1  8.4 - 40.1 (mg/dL)   GFR calc non Af Amer >90  >90 (mL/min)   GFR calc Af Amer >90  >90 (mL/min)    Disposition: to CIR today.   Follow-up Appts:     Tests Needing Follow-up: Per neurology.  Time spent in discharge (includes decision making & examination of pt): 25  minutes  Signed: Dimitria Ketchum 08/14/2011, 2:05 PM

## 2011-08-14 NOTE — Plan of Care (Signed)
Problem: Consults Goal: General Medical Patient Education See Patient Education Module for specific education.  Outcome: Completed/Met Date Met:  08/14/11 Education on MS. Written and verbal info given. Pt stated that she had been researching MS prior to admit, because she has a friend who has MS, and that she had a feeling that she (the Pt) did as well.

## 2011-08-14 NOTE — Progress Notes (Signed)
Pt admitted to 4030 via recliner, transferred with min assist stand pivot transfer to bed, oriented to rehab routines, white board, therapy schedule, meal times, team conferences, lead nursing, butterfly wish, preferred name, safety plan, agreement and video, bed alarm, MD rounding,please see CHL for details of assessment. Will continue to monitor. Roberts-VonCannon, Eligio Angert Elon Jester

## 2011-08-15 DIAGNOSIS — G35 Multiple sclerosis: Secondary | ICD-10-CM

## 2011-08-15 DIAGNOSIS — Z5189 Encounter for other specified aftercare: Secondary | ICD-10-CM

## 2011-08-15 DIAGNOSIS — R279 Unspecified lack of coordination: Secondary | ICD-10-CM

## 2011-08-15 DIAGNOSIS — G834 Cauda equina syndrome: Secondary | ICD-10-CM

## 2011-08-15 LAB — COMPREHENSIVE METABOLIC PANEL
ALT: 62 U/L — ABNORMAL HIGH (ref 0–35)
Alkaline Phosphatase: 64 U/L (ref 39–117)
BUN: 13 mg/dL (ref 6–23)
CO2: 25 mEq/L (ref 19–32)
Chloride: 103 mEq/L (ref 96–112)
GFR calc Af Amer: >90 mL/min (ref >90)
GFR calc non Af Amer: >90 mL/min (ref >90)
Glucose, Bld: 116 mg/dL — ABNORMAL HIGH (ref 70–99)
Potassium: 3.9 mEq/L (ref 3.5–5.1)
Sodium: 140 mEq/L (ref 135–145)
Total Bilirubin: 0.2 mg/dL — ABNORMAL LOW (ref 0.3–1.2)

## 2011-08-15 LAB — CBC
HCT: 33.9 % — ABNORMAL LOW (ref 36.0–46.0)
Hemoglobin: 11.4 g/dL — ABNORMAL LOW (ref 12.0–15.0)
MCHC: 33.6 g/dL (ref 30.0–36.0)
RBC: 3.81 MIL/uL — ABNORMAL LOW (ref 3.87–5.11)

## 2011-08-15 LAB — DIFFERENTIAL
Basophils Relative: 0 % (ref 0–1)
Monocytes Absolute: 0.1 10*3/uL (ref 0.1–1.0)
Monocytes Relative: 1 % — ABNORMAL LOW (ref 3–12)
Neutro Abs: 12 10*3/uL — ABNORMAL HIGH (ref 1.7–7.7)

## 2011-08-15 MED ORDER — OXYBUTYNIN CHLORIDE 5 MG PO TABS
2.5000 mg | ORAL_TABLET | Freq: Two times a day (BID) | ORAL | Status: DC
  Administered 2011-08-15 – 2011-08-16 (×3): 2.5 mg via ORAL
  Administered 2011-08-16: 21:00:00 via ORAL
  Administered 2011-08-17 (×2): 2.5 mg via ORAL
  Administered 2011-08-18: 08:00:00 via ORAL
  Administered 2011-08-18 – 2011-08-20 (×4): 2.5 mg via ORAL
  Filled 2011-08-15 (×13): qty 0.5

## 2011-08-15 MED ORDER — BACLOFEN 5 MG HALF TABLET
5.0000 mg | ORAL_TABLET | Freq: Two times a day (BID) | ORAL | Status: DC
  Administered 2011-08-15 (×2): 5 mg via ORAL
  Filled 2011-08-15 (×5): qty 1

## 2011-08-15 NOTE — Evaluation (Signed)
Speech Language Pathology Assessment and Plan  Patient Details  Name: Jeanette Estrada MRN: [id number] Date of Birth: 1976-01-19  SLP Diagnosis: cognitive impairment  Rehab Potential: Excellent ELOS: 1 week   Today's Date: 08/15/2011 Time: 1015-1130 Time Calculation (min): 75 min  Skilled Therapeutic Intervention: Administered cognitive-linguistic evaluation. Please see below for details.   Problem List:  Patient Active Problem List  Diagnoses  . Neuromuscular disease  . Multiple sclerosis  . MS (multiple sclerosis)    Past Medical History:  Past Medical History  Diagnosis Date  . Neuromuscular disease    Past Surgical History:  Past Surgical History  Procedure Date  . Tubal ligation     Assessment & Plan Clinical Impression: Patient is a 36 y.o. year old female with recent admission to the hospital on 08/12/2011 with progressive weakness in her extremities of the past few years with excessive tremulousness. She states that she had never had a formal workup for multiple sclerosis. Patient on no formal medications prior to admission. She states she can barely walk at times and uses a cane as well as some slurred speech. MRI of the brain showed widespread chronic appearing white matter disease with a pattern typical of chronic multiple sclerosis. MRI cervical spine with numerous areas of abnormal T2 signal within the cervical spinal cord consistent with multiple sclerosis involvement of the cervical spine. Neurology consulted placed on high dose intravenous Solu-Medrol x5 days for MS exacerbation. Patient transferred to CIR on 08/14/2011 .   Patient presents with mild cognitive impairment and currently requires supervision-Min A with selective attention, short-term memory, functional complex problem solving and self-monitoring and correcting of verbosity with poor topic maintenance. Pt also presents with mild dysarthria that is impacted by fatigue.   Patient will benefit from  skilled SLP intervention to increase independence and maximizing cognitive function prior to discharge home with care partner. Anticipate patient will require intermittent supervision and follow up outpatient.   SLP - End of Session Patient left: in chair;with call bell/phone within reach Nurse Communication: Cognitive/Linguistic strategies reviewed Assessment SLP Recommendation/Assessment: Patient will need skilled Speech Lanaguage Pathology Services during CIR admission Rehab Potential: Excellent Barriers to Discharge: None Therapy Diagnosis: Cognitive Impairments;Dysarthria SLP Plan SLP Frequency: 1-2 X/day, 30-60 minutes Estimated Length of Stay: 1 week SLP Treatment/Interventions: Cognitive remediation/compensation;Cueing hierarchy;Internal/external aids;Speech/Language facilitation;Therapeutic Activities;Environmental controls;Functional tasks;Patient/family education Recommendation Follow up Recommendations:  (TBD) Equipment Recommended: None recommended by SLP  SLP Evaluation Precautions/Restrictions  Precautions Precautions: Fall Precaution Comments: LE shake in weight bearing-[place]of[place] to begin as treatment Restrictions Weight Bearing Restrictions: No Pain Pain Assessment Pain Assessment: No/denies pain Pain Score: 0-No pain Prior Functioning Lives With: Family (sister in law, and children) Cognition Overall Cognitive Status: Impaired Arousal/Alertness: Awake/alert Orientation Level: Oriented X4 Attention: Selective Selective Attention: Impaired Selective Attention Impairment: Verbal basic;Functional basic Memory: Impaired Memory Impairment: Decreased short term memory Decreased Short Term Memory: Verbal complex;Functional complex Awareness: Appears intact Problem Solving: Impaired Problem Solving Impairment: Functional complex Executive Function: Self Monitoring;Self Correcting Self Monitoring: Impaired Self Monitoring Impairment: Verbal complex (decreased  with verbosity) Self Correcting: Impaired Self Correcting Impairment: Verbal complex (decreased with verbosity) Safety/Judgment: Appears intact Comprehension Auditory Comprehension Overall Auditory Comprehension: Impaired Yes/No Questions: Within Functional Limits Commands: Within Functional Limits Conversation: Simple Interfering Components: Processing speed;Working Civil Service fast streamer;Attention EffectiveTechniques: Extra processing time;Repetition Visual Recognition/Discrimination Discrimination: Within Function Limits Reading Comprehension Reading Status: Within funtional limits Expression Expression Primary Mode of Expression: Verbal Verbal Expression Overall Verbal Expression: Impaired Initiation: No impairment Automatic Speech: Name;Social Response Level of Generative/Spontaneous  Verbalization: Conversation Repetition: No impairment Naming: No impairment Pragmatics: Impairment Impairments: Topic maintenance (verbose) Written Expression Dominant Hand: Left Written Expression: Within Functional Limits Oral/Motor Oral Motor/Sensory Function Overall Oral Motor/Sensory Function: Appears within functional limits for tasks assessed Motor Speech Overall Motor Speech: Impaired Respiration: Within functional limits Resonance: Within functional limits Articulation: Impaired Level of Impairment: Conversation (impacted by fatigue) Intelligibility: Intelligible Motor Planning: Witnin functional limits  See FIM for current functional status Refer to Care Plan for Long Term Goals  Recommendations for other services: None  Discharge Criteria: Patient will be discharged from SLP if patient refuses treatment 3 consecutive times without medical reason, if treatment goals not met, if there is a change in medical status, if patient makes no progress towards goals or if patient is discharged from hospital.  The above assessment, treatment plan, treatment alternatives and goals were discussed and  mutually agreed upon: by patient  Keierra Nudo 08/15/2011, 12:22 PM  .

## 2011-08-15 NOTE — Progress Notes (Signed)
Patient ID: Jeanette Estrada, female   DOB: 05-06-1975, 36 y.o.   MRN: [id number] Subjective/Complaints: 36 year old right-handed female admitted 08/12/2011 with progressive weakness in her extremities of the past few years with with excessive tremulousness. She states that she had never had a formal workup for multiple sclerosis. Patient on no formal medications prior to admission. She states she can barely walk at times and uses a cane as well as some slurred speech. MRI of the brain showed widespread chronic appearing white matter disease with a pattern typical of chronic multiple sclerosis. MRI cervical spine with numerous areas of abnormal T2 signal within the cervical spinal cord consistent with multiple sclerosis involvement of the cervical spine. Neurology consulted placed on high dose intravenous Solu-Medrol x5 days for MS exacerbation. Urine drug screen on admission was positive for marijuana. Subcutaneous Lovenox added for DVT prophylaxis   Objective: Vital Signs: Blood pressure 107/67, pulse 63, temperature 98.7 F (37.1 C), temperature source Oral, resp. rate 16, SpO2 100.00%. No results found. Results for orders placed during the hospital encounter of 08/14/11 (from the past 72 hour(s))  CBC     Status: Abnormal   Collection Time   08/15/11  6:35 AM      Component Value Range Comment   WBC 13.0 (*) 4.0 - 10.5 (K/uL)    RBC 3.81 (*) 3.87 - 5.11 (MIL/uL)    Hemoglobin 11.4 (*) 12.0 - 15.0 (g/dL)    HCT 08.6 (*) 57.8 - 46.0 (%)    MCV 89.0  78.0 - 100.0 (fL)    MCH 29.9  26.0 - 34.0 (pg)    MCHC 33.6  30.0 - 36.0 (g/dL)    RDW 46.9  62.9 - 52.8 (%)    Platelets 193  150 - 400 (K/uL)   DIFFERENTIAL     Status: Abnormal   Collection Time   08/15/11  6:35 AM      Component Value Range Comment   Neutrophils Relative 92 (*) 43 - 77 (%)    Neutro Abs 12.0 (*) 1.7 - 7.7 (K/uL)    Lymphocytes Relative 7 (*) 12 - 46 (%)    Lymphs Abs 0.9  0.7 - 4.0 (K/uL)    Monocytes Relative 1 (*) 3 -  12 (%)    Monocytes Absolute 0.1  0.1 - 1.0 (K/uL)    Eosinophils Relative 0  0 - 5 (%)    Eosinophils Absolute 0.0  0.0 - 0.7 (K/uL)    Basophils Relative 0  0 - 1 (%)    Basophils Absolute 0.0  0.0 - 0.1 (K/uL)      HEENT: normal Cardio: RRR Resp: CTA B/L GI: BS positive Extremity:  No Edema Skin:   Intact Neuro: Alert/Oriented, Abnormal Sensory reduced lt touch in feet, Abnormal Motor 3-/5 on Left and 4/5 on R, Reflexes: 3+ and Dysarthric Musc/Skel:  Normal   Assessment/Plan: 1. Functional deficits secondary to Multiple sclerosis causing Left hemiparesis, neurogenic bladder,spasticity newly diagnosed which require 3+ hours per day of interdisciplinary therapy in a comprehensive inpatient rehab setting. Physiatrist is providing close team supervision and 24 hour management of active medical problems listed below. Physiatrist and rehab team continue to assess barriers to discharge/monitor patient progress toward functional and medical goals. FIM:       FIM - Toileting Toileting steps completed by patient: Adjust clothing prior to toileting;Performs perineal hygiene;Adjust clothing after toileting Toileting: 4: Steadying assist  FIM - Diplomatic Services operational officer Devices: Grab bars Toilet Transfers: 4-From toilet/BSC: Min A (  steadying Pt. > 75%);4-To toilet/BSC: Min A (steadying Pt. > 75%)  FIM - Bed/Chair Transfer Bed/Chair Transfer: 4: Bed > Chair or W/C: Min A (steadying Pt. > 75%);4: Chair or W/C > Bed: Min A (steadying Pt. > 75%)     Comprehension Comprehension Mode: Auditory Comprehension: 6-Follows complex conversation/direction: With extra time/assistive device  Expression Expression Mode: Verbal Expression: 6-Expresses complex ideas: With extra time/assistive device  Social Interaction Social Interaction: 6-Interacts appropriately with others with medication or extra time (anti-anxiety, antidepressant).  Problem Solving Problem Solving:  6-Solves complex problems: With extra time  Memory Memory: 6-More than reasonable amt of time  2. Anticoagulation/DVT prophylaxis with Pharmaceutical: Lovenox 3.  Neurogenic bladder [facility] oxybutnin 4.  Spasticity baclofen trial  LOS (Days) 1 A FACE TO FACE EVALUATION WAS PERFORMED  Arby Dahir E 08/15/2011, 7:19 AM

## 2011-08-15 NOTE — Progress Notes (Signed)
Subjective:  Patient is working with therapy. Says that she feels great. Feels stronger and answers questions at a quicker pace today.  Objective: Current vital signs: BP 107/67  Pulse 80  Temp(Src) 98.7 F (37.1 C) (Oral)  Resp 16  SpO2 100% Vital signs in last 24 hours: Temp:  [98.1 F (36.7 C)-98.7 F (37.1 C)] 98.7 F (37.1 C) (05/30 0553) Pulse Rate:  [63-80] 80  (05/30 0845) Resp:  [16-20] 16  (05/30 0553) BP: (101-107)/(67-71) 107/67 mmHg (05/30 0553) SpO2:  [100 %] 100 % (05/30 0553)  Intake/Output from previous day: 05/29 0701 - 05/30 0700 In: 360 [P.O.:360] Out: -  Intake/Output this shift: Total I/O In: 240 [P.O.:240] Out: -  Nutritional status: General  Neurologic Exam: History obtained from the patient  General ROS: negative for - chills, fatigue, fever, night sweats, weight gain or weight loss Psychological ROS: negative for - behavioral disorder, hallucinations, memory difficulties, mood swings or suicidal ideation Ophthalmic ROS: negative for - blurry vision, double vision, eye pain or loss of vision ENT ROS: negative for - epistaxis, nasal discharge, oral lesions, sore throat, tinnitus or vertigo Allergy and Immunology ROS: negative for - hives or itchy/watery eyes Hematological and Lymphatic ROS: negative for - bleeding problems, bruising or swollen lymph nodes Endocrine ROS: negative for - galactorrhea, hair pattern changes, polydipsia/polyuria or temperature intolerance Respiratory ROS: negative for - cough, hemoptysis, shortness of breath or wheezing Cardiovascular ROS: negative for - chest pain, dyspnea on exertion, edema or irregular heartbeat Gastrointestinal ROS: negative for - abdominal pain, diarrhea, hematemesis, nausea/vomiting or stool incontinence Genito-Urinary ROS: negative for - dysuria, hematuria, incontinence or urinary frequency/urgency Musculoskeletal ROS: negative for - joint swelling or muscular weakness Neurological ROS: as noted  in HPI Dermatological ROS: negative for rash and skin lesion changes    Lab Results: CBC:  Lab 08/15/11 0635 08/12/11 0330 08/11/11 2200  WBC 13.0* 7.9 --  NEUTROABS 12.0* -- 7.7  HGB 11.4* 10.9* --  HCT 33.9* 32.0* --  MCV 89.0 87.9 --  PLT 193 212 --   Basic Metabolic Panel:  Lab 08/15/11 1610 08/14/11 0545  NA 140 141  K 3.9 4.1  CL 103 110  CO2 25 22  GLUCOSE 116* 109*  BUN 13 11  CREATININE 0.63 0.68  CALCIUM 9.2 9.1  MG -- --  PHOS -- --   Liver Function Tests:  Lab 08/15/11 0635 08/12/11 0330  AST 30 14  ALT 62* 10  ALKPHOS 64 59  BILITOT 0.2* 0.4  PROT 6.6 5.7*  ALBUMIN 3.7 3.2*    Medications:  Scheduled:   . baclofen  5 mg Oral BID  . enoxaparin  40 mg Subcutaneous Daily  . methylPREDNISolone (SOLU-MEDROL) injection  1,000 mg Intravenous Q24H  . oxybutynin  2.5 mg Oral BID  . pantoprazole  40 mg Oral Q1200    Assessment/Plan:       Assessment: Patient with Multiple Sclerosis    Plan: -Finish Day #5 of methylprednisolone  -Refer to Outpatient Neurologist once LOS in rehab is complete. I have not made this appointment since I am unsure of LOS.  -Patient doing well, pleased with progress.  No further neurologic intervention is recommended at this time.  If further questions arise, please call or page at that time.  Thank you for allowing neurology to participate in the care of this patient.       LOS: 1 day   Guy Franco [facility] Pager [phone]  08/15/2011  11:25 AM

## 2011-08-15 NOTE — Progress Notes (Signed)
[facility] Individual Statement of Services  Patient Name:  Jeanette Estrada  Date:  08/15/2011  Welcome to the [facility].  Our goal is to provide you with an individualized program based on your diagnosis and situation, designed to meet your specific needs.  With this comprehensive rehabilitation program, you will be expected to participate in at least 3 hours of rehabilitation therapies Monday-Friday, with modified therapy programming on the weekends.  Your rehabilitation program will include the following services:  Physical Therapy (PT), Occupational Therapy (OT), 24 hour per day rehabilitation nursing, Therapeutic Recreaction (TR), Case Management (RN and Child psychotherapist), Rehabilitation Medicine, Nutrition Services and Pharmacy Services  Weekly team conferences will be held on Wednesdays to discuss your progress.  Your RN Case Designer, television/film set will talk with you frequently to get your input and to update you on team discussions.  Team conferences with you and your family in attendance may also be held.  Expected length of stay: 7 days  Overall anticipated outcome: Modified independent  Depending on your progress and recovery, your program may change.  Your RN Case Estate agent will coordinate services and will keep you informed of any changes.  Your RN Sports coach and SW names and contact numbers are listed  below.  The following services may also be recommended but are not provided by the [facility]:   Driving Evaluations  Home Health Rehabiltiation Services  Outpatient Rehabilitatation [facility]  Vocational Rehabilitation   Arrangements will be made to provide these services after discharge if needed.  Arrangements include referral to agencies that provide these services.  Your insurance has been verified to be: None at present Your primary doctor is:  Dr. Erlinda Hong  Pertinent information will  be shared with your doctor and your insurance company.  Case Manager: Melanee Spry, [facility] [phone]  Social Worker:  Dossie Der, [place] [phone]  Information discussed with and copy given to patient by: Meryl Dare, 08/15/2011

## 2011-08-15 NOTE — Progress Notes (Signed)
Occupational Therapy Session Note  Patient Details  Name: Jeanette Estrada MRN: [id number] Date of Birth: July 13, 1975  Today's Date: 08/15/2011 Time: 1430-1500 Time Calculation (min): 30 min  Short Term Goals: Week 1:     Skilled Therapeutic Interventions/Progress Updates:  Introduced options for tub/shower equipment at home.  Pt stated there were 10 people living in home and thought a tub seat would be less intrusive.  Pt practiced stepping over into tub with min A.  Pt practiced leaning back onto edge of seat and swinging legs over into tub.  Recommendation to patient was the latter method.  Pt practiced sit to stand without using BUE to push up from surface.  Pt places hands on knees and pushes up from this position.  Pt supervision with this task.   Therapy Documentation Precautions:  Precautions Precautions: Fall Precaution Comments: LE shake in weight bearing-BAclofen to begin as treatment Restrictions Weight Bearing Restrictions: No Pain: Pain Assessment Pain Assessment: No/denies pain  See FIM for current functional status  Therapy/Group: Individual Therapy  Rich Brave 08/15/2011, 3:15 PM

## 2011-08-15 NOTE — Evaluation (Addendum)
Occupational Therapy Assessment and Plan  Patient Details  Name: Jeanette Estrada MRN: [id number] Date of Birth: Oct 15, 1975  OT Diagnosis: abnormal posture, altered mental status, ataxia, cognitive deficits and disturbance of vision Rehab Potential: Rehab Potential: Good ELOS:   7 days  Today's Date: 08/15/2011 Time: 0900-1010   Time Calculation:  70 min  Problem List:  Patient Active Problem List  Diagnoses  . Neuromuscular disease  . Multiple sclerosis  . MS (multiple sclerosis)    Past Medical History:  Past Medical History  Diagnosis Date  . Neuromuscular disease    Past Surgical History:  Past Surgical History  Procedure Date  . Tubal ligation     Assessment & Plan Clinical Impression: Patient is a 36 y.o. year old female with recent admission to the hospital on 08/12/2011 with progressive weakness in her extremities of the past few years with excessive tremulousness. She states that she had never had a formal workup for multiple sclerosis. Patient on no formal medications prior to admission. She states she can barely walk at times and uses a cane as well as some slurred speech. MRI of the brain showed widespread chronic appearing white matter disease with a pattern typical of chronic multiple sclerosis. MRI cervical spine with numerous areas of abnormal T2 signal within the cervical spinal cord consistent with multiple sclerosis involvement of the cervical spine. Neurology consulted placed on high dose intravenous Solu-Medrol x5 days for MS exacerbation.   Patient transferred to CIR on 08/14/2011 .    Patient currently requires min with basic self-care skills secondary to ataxia and decreased coordination and oculomotor malalignment, and blurred vision..  Prior to hospitalization, patient could complete basic self care and IADL with intermittent min assist.  Patient with multiple falls prior to this hospitalization.  Patient will benefit from skilled intervention to increase  independence with basic self-care skills and increase level of independence with iADL prior to discharge home with care partner.  Anticipate patient will require intermittent supervision and follow up outpatient.  OT - End of Session Activity Tolerance: Tolerates 30+ min activity with multiple rests OT Assessment Rehab Potential: Good OT Plan OT Frequency: 1-2 X/day, 60-90 minutes OT Treatment/Interventions: Balance/vestibular training;Cognitive remediation/compensation;Community reintegration;Discharge planning;Disease mangement/prevention;DME/adaptive equipment instruction;Functional mobility training;Neuromuscular re-education;Patient/family education;Psychosocial support;Self Care/advanced ADL retraining;Therapeutic Activities;Therapeutic Exercise;UE/LE Coordination activities;Visual/perceptual remediation/compensation OT Recommendation Recommendations for Other Services: Neuropsych consult Follow Up Recommendations: Home health OT;Outpatient OT Equipment Recommended: Tub/shower bench  OT Evaluation Precautions/Restrictions  Precautions Precautions: Fall Precaution Comments: LE shake in weight bearing-BAclofen to begin as treatment Restrictions Weight Bearing Restrictions: No General Chart Reviewed: Yes Vital Signs Therapy Vitals Pulse Rate: 80  (with activity) Pain Pain Assessment Pain Assessment: No/denies pain Pain Score: 0-No pain Home Living/Prior Functioning Home Living Lives With: Family (sister in law, and children) Available Help at Discharge: Family Type of Home: House Home Access: Stairs to enter Secretary/administrator of Steps: 2-3 Entrance Stairs-Rails:  (grabs posts on both sides) Home Layout: Two level Alternate Level Stairs-Number of Steps: 12 Alternate Level Stairs-Rails: Right Bathroom Shower/Tub: Engineer, manufacturing systems: Standard Home Adaptive Equipment: Straight cane IADL History Homemaking Responsibilities: Yes Meal Prep Responsibility:  Primary Laundry Responsibility: Primary Cleaning Responsibility: Primary Shopping Responsibility: Primary Child Care Responsibility: Primary Current License: Yes (Reports driving children to school each am) Occupation: Unemployed Prior Function Level of Independence: Independent with basic ADLs (although failing ability over last few months) Able to Take Stairs?: Yes (cane if no rails) Driving: Yes 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 ADL  Vision/Perception  Vision - History Baseline Vision:  (Malalignment x several years) Visual History:  (blurry, no diplopia) Patient Visual Report: No change from baseline;Blurring of vision Vision - Assessment Eye Alignment: Impaired (comment) Perception Perception: Within Functional Limits Praxis Praxis: Intact  Cognition Overall Cognitive Status: Impaired Arousal/Alertness: Awake/alert Orientation Level: Oriented X4 Attention: Selective Selective Attention: Impaired (verbose,constant talking loosely related to topic) Selective Attention Impairment: Functional basic Memory: Impaired Memory Impairment: Decreased short term memory Decreased Short Term Memory: Functional complex Awareness: Appears intact Problem Solving: Impaired Problem Solving Impairment: Functional complex Sensation Sensation Light Touch: Impaired Detail Stereognosis: Appears Intact Proprioception: Appears Intact Additional Comments: numb at times in LE's. worse L, but not today Coordination Gross Motor Movements are Fluid and Coordinated: No Fine Motor Movements are Fluid and Coordinated: No Coordination and Movement Description: LE's tremors, locks knees to reduce shaking.  Knee flexion exasperates movement Finger Nose Finger Test: more difficulty with R Motor  Motor Motor: Ataxia;Abnormal postural alignment and control Mobility     Trunk/Postural Assessment  Cervical Assessment Cervical Assessment: Within Functional  Limits Thoracic Assessment Thoracic Assessment: Within Functional Limits Lumbar Assessment Lumbar Assessment: Within Functional Limits Postural Control Postural Control: Deficits on evaluation  Balance Balance Balance Assessed: Yes Static Sitting Balance Static Sitting - Balance Support: Feet supported Static Sitting - Level of Assistance: 7: Independent Dynamic Sitting Balance Dynamic Sitting - Level of Assistance: 6: Modified independent (Device/Increase time) Static Standing Balance Static Standing - Balance Support: No upper extremity supported Static Standing - Level of Assistance: 4: Min assist Dynamic Standing Balance Dynamic Standing - Balance Support: Right upper extremity supported Dynamic Standing - Level of Assistance: 4: Min assist Dynamic Standing - Balance Activities: Lateral lean/weight shifting;Forward lean/weight shifting;Reaching across midline Extremity/Trunk Assessment RUE Assessment RUE Assessment: Exceptions to [facility] LUE Assessment LUE Assessment: Exceptions to [facility]  See FIM for current functional status Refer to Care Plan for Long Term Goals  Recommendations for other services: Neuropsych  Discharge Criteria: Patient will be discharged from OT if patient refuses treatment 3 consecutive times without medical reason, if treatment goals not met, if there is a change in medical status, if patient makes no progress towards goals or if patient is discharged from hospital.  The above assessment, treatment plan, treatment alternatives and goals were discussed and mutually agreed upon: by patient  Collier Salina 08/15/2011, 11:26 AM

## 2011-08-15 NOTE — Progress Notes (Signed)
Social Work Assessment and Plan Social Work Assessment and Plan  Patient Details  Name: Jeanette Estrada MRN: [id number] Date of Birth: 04/17/1975  Today's Date: 08/15/2011  Problem List:  Patient Active Problem List  Diagnoses  . Neuromuscular disease  . Multiple sclerosis  . MS (multiple sclerosis)   Past Medical History:  Past Medical History  Diagnosis Date  . Neuromuscular disease    Past Surgical History:  Past Surgical History  Procedure Date  . Tubal ligation    Social History:  reports that she has been smoking Cigarettes.  She has been smoking about 3 packs per day. She has never used smokeless tobacco. She reports that she drinks alcohol. She reports that she does not use illicit drugs.  Family / Support Systems Marital Status: Separated How Long?: Years Patient Roles: Parent;Other (Comment) (Sibling) Other Supports: deena Livingston-sister  [id number]-[phone] Anticipated Caregiver: Sister and Aunt along with her brother Ability/Limitations of Caregiver: Sister to work out someone to be there with pt between she and brother and Aunt Caregiver Availability: 24/7 Family Dynamics: Pt is close to her family-sister.  Her Mom is in [place] and plans to take pt's three children fro the summer.  Pt doesn't complain much due to living in someone else's home.  She is grateful for her family.  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  " There are more than one way to get to the results you want to achieve."  Pt is unique in her view of life and is a positive light.  She is bright and always looking for the positive in a otherwise difficult situation. Recent Psychosocial Issues: Other medical issues- Disability denials-caring fro her children when she was ill Pyschiatric History: NO history- depression screen score-2.  She reports: " I am relieved to finally know what this is."  She feels now she can move forward and can be proprerly medically treated.  She appears to be coping appropriately at this point will continue to monitor her coping. Substance Abuse History: NO issues  Patient / Family Perceptions, Expectations & Goals Pt/Family understanding of illness & functional limitations: Pt is able to explain her diagnosis and deficits.  She has read up on MS and is wanting to know all of the information she can find out about it.  She is releived to finally know what is wrong with her, it has taken years.  She is willing to do what she needs to do to be independent and is willing to put forth the effort. Premorbid pt/family roles/activities: Mom, Sibling, Daughter, Psychologist, occupational, etc Anticipated changes in roles/activities/participation: Plans to resume Pt/family expectations/goals: Pt states: " I want to be independent I will get there, it will take time, but I will make it."  Pt reports: " I read a lot and want to find out as much information as I can."  [employer]: None Premorbid Home Care/DME Agencies: None Transportation available at discharge: Family Resource referrals recommended: Support group (specify) (MS Society)  Discharge Planning Living Arrangements: Children;Other relatives Support Systems: Children;Other relatives;Friends/neighbors;Church/faith community  Type of Residence: Private residence Insurance Resources: Customer service manager  Resources: Family Support Financial Screen Referred: Yes Living Expenses: Lives with family Money Management: Patient;Family Do you have any problems obtaining your medications?: Yes (Describe) Home Management: Pt and family Patient/Family Preliminary Plans: Return home with family working out someone to be there with her, between them.  Her children are going to [facility] for the summer next week and be with her Mother. Social Work Anticipated Follow Up Needs: HH/OP;Support Group DC Planning Additional Notes/Comments: Pt is progressing fast, will be short length of stay.  To apply for Medicaid before leaves the hospital.  Clinical Impression Pleasant female who is upbeat and very open regarding her concerns and problems.  She is motivated and willing to do what she needs to be independent. Short length of stay may be in time for daughter's 5th grade graduation.  Lucy Chris 08/15/2011, 4:04 PM

## 2011-08-15 NOTE — Evaluation (Addendum)
Physical Therapy Assessment and Plan  Patient Details  Name: Jeanette Estrada MRN: [id number] Date of Birth: October 20, 1975  PT Diagnosis: Abnormality of gait, Ataxia, Cognitive deficits, Coordination disorder, Impaired cognition, Impaired sensation and Muscle weakness Rehab Potential: Good ELOS: 1 week   Today's Date: 08/15/2011 Time: 0800-0900 Time Calculation (min): 60 min  Problem List:  Patient Active Problem List  Diagnoses  . Neuromuscular disease  . Multiple sclerosis  . MS (multiple sclerosis)    Past Medical History:  Past Medical History  Diagnosis Date  . Neuromuscular disease    Past Surgical History:  Past Surgical History  Procedure Date  . Tubal ligation     Assessment & Plan Clinical Impression Head CT 08/11/2011 Findings: There is abnormal FLAIR and T2 signal throughout the brain in a pattern consistent with advanced chronic multiple sclerosis. There is involvement are seen within the pons, middle cerebellar peduncles, hemispheric white matter, thalami, hemispheric deep white matter and hemispheric subcortical white matter. There is extensive involvement of the corpus callosum. There are a few areas of cortical involvement. None of these shows restricted diffusion. There is only a single focus in the right frontal lobe that shows any contrast enhancement. No evidence of mass lesion, hemorrhage, hydrocephalus or extra-axial collection. No calvarial abnormality. Sinuses, middle ears and mastoids are clear. IMPRESSION: Widespread chronic appearing white matter disease with a pattern typical of chronic multiple sclerosis. No lesions show restricted diffusion. A single lesion in the right frontal lobe shows mild contrast enhancement. MRI CERVICAL SPINE WITHOUT AND WITH CONTRAST Technique: Multiplanar and multiecho pulse sequences of the cervic al spine, to include the craniocervical junction and cervicothoraci c junction, were obtained without and with intravenous contrast.  Findings: There is no significant degenerative disease in the cervical region. No spinal stenosis. No foraminal stenosis. No osseous or articular pathology. There are numerous areas of abnormal T2 signal within the cervical spinal cord consistent with multiple sclerosis involvement of the cervical spine. None of these shows contrast enhancement. No evidence of cord hemorrhage. No evidence of syrinx or cord atrophy. IMPRESSION 36 year old right-handed female admitted 08/12/2011 with progressive weakness in her extremities of the past few years with with excessive tremulousness. She states that she had never had a formal workup for multiple sclerosis. Patient on no formal medications prior to admission. She states she can barely walk at times and uses a cane as well as some slurred speech. MRI of the brain showed widespread chronic appearing white matter disease with a pattern typical of chronic multiple sclerosis. MRI cervical spine with numerous areas of abnormal T2 signal within the cervical spinal cord consistent with multiple sclerosis involvement of the cervical spine. Neurology consulted placed on high dose intravenous Solu-Medrol x5 days for MS exacerbation. Urine drug screen on admission was positive for marijuana. Subcutaneous Lovenox added for DVT prophylaxis.    Patient currently requires min with mobility secondary to muscle weakness, impaired timing and sequencing, unbalanced muscle activation, ataxia and decreased coordination, decreased attention, decreased problem solving, decreased memory and delayed processing and decreased standing balance, decreased postural control and decreased balance strategies.  Prior to hospitalization, patient was mod I-I in the home, used cane at times, drove but used motorized cart in stores, frequent falls. with mobility and lived with Family (sister, her kids, aunt) in a House . Home access is 2-3Stairs to enter. With post to hold and 12 steps to basement where she  sleeps.  Patient will benefit from skilled PT intervention to maximize safe functional mobility,  minimize fall risk and decrease caregiver burden for planned discharge home with 24 hour supervision.  Anticipate patient will OP vs HHPT TBD at discharge.  PT - End of Session Activity Tolerance: Tolerates 30+ min activity without fatigue PT Assessment Rehab Potential: Good Barriers to Discharge: None PT Plan PT Frequency: 1-2 X/day, 60-90 minutes Estimated Length of Stay: 1 week PT Treatment/Interventions: Ambulation/gait training;Functional mobility training;UE/LE Strength taining/ROM;UE/LE Coordination activities;Neuromuscular re-education;Balance/vestibular training;Cognitive remediation/compensation;DME/adaptive equipment instruction;Community reintegration;Patient/family education;Therapeutic Exercise;Therapeutic Activities PT Recommendation Follow Up Recommendations: Outpatient PT;Home health PT Equipment Recommended: Rolling walker with 5" wheels  PT Evaluation Precautions/Restrictions Precautions Precautions: Fall Precaution Comments: LE shake in weight bearing-BAclofen to begin as treatment Vital Signs 80 with activity Pain Pain Assessment Pain Assessment: No/denies pain Home Living/Prior Functioning Home Living Lives With: Family (sister, her kids, aunt) Available Help at Discharge: Family Type of Home: House Home Access: Stairs to enter Secretary/administrator of Steps: 2-3 Entrance Stairs-Rails:  (grabs posts on both sides) Home Layout: Two level Alternate Level Stairs-Number of Steps: 12 Alternate Level Stairs-Rails: Right Home Adaptive Equipment: Straight cane Prior Function Able to Take Stairs?: Yes (cane if no rails) Driving: Yes 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 worse L, but not today Coordination Gross Motor Movements are Fluid and Coordinated: No Fine Motor Movements are Fluid and Coordinated: No Coordination and Movement Description: shaking in LE's in standing/weight bearing Finger Nose Finger Test: more difficulty with R Motor  Motor Motor: Abnormal postural alignment and control;Ataxia  Mobility   Locomotion     Trunk/Postural Assessment  Cervical Assessment Cervical Assessment: Within Functional Limits Thoracic Assessment Thoracic Assessment: Within Functional Limits Lumbar Assessment Lumbar Assessment: Within Functional Limits Postural Control Postural Control: Deficits on evaluation  Balance Balance Balance Assessed: Yes Static Sitting Balance Static Sitting - Balance Support: Feet supported Static Sitting - Level of Assistance: 7: Independent Dynamic Sitting Balance Dynamic Sitting - Level of Assistance: 6: Modified independent (Device/Increase time) Static Standing Balance Static Standing - Balance Support: No upper extremity supported Static Standing - Level of Assistance: 4: Min assist Dynamic Standing Balance Dynamic Standing - Level of Assistance: 4: Min assist Dynamic Standing - Balance Activities: Lateral lean/weight shifting;Forward lean/weight shifting;Reaching across midline Extremity Assessment      RLE Assessment RLE Assessment: Exceptions to [facility] RLE Strength RLE Overall Strength: Deficits RLE Overall Strength Comments: grossly 4/5 except ankle jerked/decreased  control noted with MMT LLE Assessment LLE Assessment: Exceptions to [facility] LLE Strength LLE Overall Strength: Deficits LLE Overall Strength Comments: weaker than RLE, ankle 3-/5, other 4-/5  See FIM for current functional status Refer to Care Plan for Long Term Goals  Recommendations for other services: None  Discharge Criteria: Patient will be discharged from PT if patient refuses treatment 3 consecutive times without medical reason, if treatment goals not met, if there is a change in medical status, if patient makes no progress towards goals or if patient is discharged from hospital.  The above assessment, treatment plan, treatment alternatives and  goals were discussed and mutually agreed upon: by patient  Skilled intervention: Nustep x 10 minutes for LE strength and motor control, aerobic conditioning, level 6 with METS >2.6, 1 rest break; gait training- LE shake in weight bearing and she stands with wide BOS, initial posterior LOB that she caught when closed eyes.  Gait without device with challenges sudden stops, change in directions and cadence unsteady min A, cane slightly more steady but min A holds cane in L hand and held RUE tight to side, gait with RW- which pt prefers- close S but controlled environment only. Instruction on proper RW use.  Velocity with RW 2.05 ft/sec  Michaelene Song 08/15/2011, 9:08 AM

## 2011-08-15 NOTE — Progress Notes (Signed)
Patient information reviewed and entered into [facility] system by Malcolm Hetz, RN, CRRN, PPS Coordinator.  Information including medical coding and functional independence measure will be reviewed and updated through discharge.    

## 2011-08-15 NOTE — Evaluation (Signed)
Recreational Therapy Assessment and Plan  Patient Details  Name: Jeanette Estrada MRN: [id number] Date of Birth: 11/03/1975 Today's Date: 08/15/2011  Rehab Potential: Good ELOS: 10 days   Assessment Clinical Impression: Problem List:  Patient Active Problem List   Diagnoses   .  Neuromuscular disease   .  Multiple sclerosis   .  MS (multiple sclerosis)    Past Medical History:  Past Medical History   Diagnosis  Date   .  Neuromuscular disease     Past Surgical History:  Past Surgical History   Procedure  Date   .  Tubal ligation     Assessment & Plan  Clinical Impression  36 year old right-handed female admitted 08/12/2011 with progressive weakness in her extremities of the past few years with with excessive tremulousness. She states that she had never had a formal workup for multiple sclerosis. Patient on no formal medications prior to admission. She states she can barely walk at times and uses a cane as well as some slurred speech. MRI of the brain showed widespread chronic appearing white matter disease with a pattern typical of chronic multiple sclerosis. MRI cervical spine with numerous areas of abnormal T2 signal within the cervical spinal cord consistent with multiple sclerosis involvement of the cervical spine. Neurology consulted placed on high dose intravenous Solu-Medrol x5 days for MS exacerbation. Urine drug screen on admission was positive for marijuana. Subcutaneous Lovenox added for DVT prophylaxis.   Patient presents with decreased activity tolerance, decreased functional mobility, decreased balance, ataxia, decreased cognition limiting pts independence with leisure/community pursuits. TR focus will be on community reintegration during ELOS, with goal to follow.  Leisure History/Participation Premorbid leisure interest/current participation: Games - Clinical cytogeneticist - Cards Leisure Participation Style: With Family/Friends Awareness of Community Resources:  Good-identify 3 post discharge leisure resources Psychosocial / Spiritual Patient agreeable to Pet Therapy: No Does patient have pets?: No Social interaction - Mood/Behavior: Cooperative Film/video editor for Education?: Yes Recreational Therapy Orientation Orientation -Reviewed with patient: Available activity resources Strengths/Weaknesses Patient Strengths/Abilities: Willingness to participate;Active premorbidly Patient weaknesses: Physical limitations  Plan Rec Therapy Plan Is patient appropriate for Therapeutic Recreation?: Yes Rehab Potential: Good Treatment times per week: Min 1 time for community reintegratin/education Estimated Length of Stay: 10 days TR Treatment/Interventions: [employer] reintegration;Functional mobility training;Adaptive equipment instruction;Therapeutic activities;Patient/family education  Recommendations for other services: None  Discharge Criteria: Patient will be discharged from TR if patient refuses treatment 3 consecutive times without medical reason.  If treatment goals not met, if there is a change in medical status, if patient makes no progress towards goals or if patient is discharged from hospital.  The above assessment, treatment plan, treatment alternatives and goals were discussed and mutually agreed upon: by patient  Daquana Paddock 08/15/2011, 4:19 PM

## 2011-08-16 DIAGNOSIS — G834 Cauda equina syndrome: Secondary | ICD-10-CM

## 2011-08-16 DIAGNOSIS — G35 Multiple sclerosis: Secondary | ICD-10-CM

## 2011-08-16 DIAGNOSIS — R279 Unspecified lack of coordination: Secondary | ICD-10-CM

## 2011-08-16 DIAGNOSIS — Z5189 Encounter for other specified aftercare: Secondary | ICD-10-CM

## 2011-08-16 MED ORDER — BACLOFEN 5 MG HALF TABLET
5.0000 mg | ORAL_TABLET | Freq: Three times a day (TID) | ORAL | Status: DC
  Administered 2011-08-16 – 2011-08-17 (×4): 5 mg via ORAL
  Filled 2011-08-16 (×9): qty 1

## 2011-08-16 NOTE — Progress Notes (Signed)
Physical Therapy Session Note  Patient Details  Name: Jeanette Estrada MRN: [id number] Date of Birth: 1976/02/27  Today's Date: 08/16/2011 Time: 4098-1191 Time Calculation (min): 45 min  Short Term Goals: Week 1:  PT Short Term Goal 1 (Week 1): LTG = STG  Skilled Therapeutic Interventions/Progress Updates:   See below for details.  Pt talks in a manner where her thoughts do not seem complete.  As the listener you have to "fill in the gaps", to understand what she is talking about.  Therapy Documentation Precautions:  Precautions Precautions: Fall Precaution Comments: LE shake in weight bearing-BAclofen to begin as treatment Restrictions Weight Bearing Restrictions: No Pain:  No complaints Mobility:  Sit to stand with min@.  As soon as pt stands on her LEs they start to tremor and they do so the whole time she is walking or standing/weight bearing on them. Locomotion :  Gait training on the unit with RW with min@.  Pt is at a high risk for falls due to her tremors.  Balance:  Dynamic balance activities attempting to increase pt's ability to maintain single limb stance by tapping cones with her feet.  Pt unable to perform without UE support, needing min@ for balance.  Difficult for pt to coordinate/control placement of feet on cones.  Standing with hands free to fold towels x 5 min with close supervision. Exercises:  Nustep for strengthening and coordination x 5 min on workload=4.  Pt with some difficulty keeping feet on pedals due to tremors. Other Treatments:    See FIM for current functional status  Therapy/Group: Individual Therapy  Georges Mouse 08/16/2011, 2:04 PM

## 2011-08-16 NOTE — Progress Notes (Signed)
Patient ID: Jeanette Estrada, female   DOB: 1975-06-03, 36 y.o.   MRN: [id number] Subjective/Complaints: 36 year old right-handed female admitted 08/12/2011 with progressive weakness in her extremities of the past few years with with excessive tremulousness. She states that she had never had a formal workup for multiple sclerosis. Patient on no formal medications prior to admission. She states she can barely walk at times and uses a cane as well as some slurred speech. MRI of the brain showed widespread chronic appearing white matter disease with a pattern typical of chronic multiple sclerosis. MRI cervical spine with numerous areas of abnormal T2 signal within the cervical spinal cord consistent with multiple sclerosis involvement of the cervical spine. Neurology consulted placed on high dose intravenous Solu-Medrol x5 days for MS exacerbation. Urine drug screen on admission was positive for marijuana. Subcutaneous Lovenox added for DVT prophylaxis  Pt still shaking with standing Objective: Vital Signs: Blood pressure 111/68, pulse 72, temperature 98.6 F (37 C), temperature source Oral, resp. rate 20, SpO2 100.00%. No results found. Results for orders placed during the hospital encounter of 08/14/11 (from the past 72 hour(s))  CBC     Status: Abnormal   Collection Time   08/15/11  6:35 AM      Component Value Range Comment   WBC 13.0 (*) 4.0 - 10.5 (K/uL)    RBC 3.81 (*) 3.87 - 5.11 (MIL/uL)    Hemoglobin 11.4 (*) 12.0 - 15.0 (g/dL)    HCT 40.9 (*) 81.1 - 46.0 (%)    MCV 89.0  78.0 - 100.0 (fL)    MCH 29.9  26.0 - 34.0 (pg)    MCHC 33.6  30.0 - 36.0 (g/dL)    RDW 91.4  78.2 - 95.6 (%)    Platelets 193  150 - 400 (K/uL)   COMPREHENSIVE METABOLIC PANEL     Status: Abnormal   Collection Time   08/15/11  6:35 AM      Component Value Range Comment   Sodium 140  135 - 145 (mEq/L)    Potassium 3.9  3.5 - 5.1 (mEq/L)    Chloride 103  96 - 112 (mEq/L)    CO2 25  19 - 32 (mEq/L)    Glucose, Bld  116 (*) 70 - 99 (mg/dL)    BUN 13  6 - 23 (mg/dL)    Creatinine, Ser 2.13  0.50 - 1.10 (mg/dL)    Calcium 9.2  8.4 - 10.5 (mg/dL)    Total Protein 6.6  6.0 - 8.3 (g/dL)    Albumin 3.7  3.5 - 5.2 (g/dL)    AST 30  0 - 37 (U/L)    ALT 62 (*) 0 - 35 (U/L)    Alkaline Phosphatase 64  39 - 117 (U/L)    Total Bilirubin 0.2 (*) 0.3 - 1.2 (mg/dL)    GFR calc non Af Amer >90  >90 (mL/min)    GFR calc Af Amer >90  >90 (mL/min)   DIFFERENTIAL     Status: Abnormal   Collection Time   08/15/11  6:35 AM      Component Value Range Comment   Neutrophils Relative 92 (*) 43 - 77 (%)    Neutro Abs 12.0 (*) 1.7 - 7.7 (K/uL)    Lymphocytes Relative 7 (*) 12 - 46 (%)    Lymphs Abs 0.9  0.7 - 4.0 (K/uL)    Monocytes Relative 1 (*) 3 - 12 (%)    Monocytes Absolute 0.1  0.1 - 1.0 (K/uL)  Eosinophils Relative 0  0 - 5 (%)    Eosinophils Absolute 0.0  0.0 - 0.7 (K/uL)    Basophils Relative 0  0 - 1 (%)    Basophils Absolute 0.0  0.0 - 0.1 (K/uL)      HEENT: normal Cardio: RRR Resp: CTA B/L GI: BS positive Extremity:  No Edema Skin:   Intact Neuro: Alert/Oriented, Abnormal Sensory reduced lt touch in feet, Abnormal Motor 3-/5 on Left and 4/5 on R, Reflexes: 3+ and Dysarthric Musc/Skel:  Normal   Assessment/Plan: 1. Functional deficits secondary to Multiple sclerosis causing Left hemiparesis, neurogenic bladder,spasticity newly diagnosed which require 3+ hours per day of interdisciplinary therapy in a comprehensive inpatient rehab setting. Physiatrist is providing close team supervision and 24 hour management of active medical problems listed below. Physiatrist and rehab team continue to assess barriers to discharge/monitor patient progress toward functional and medical goals. FIM: FIM - Bathing Bathing Steps Patient Completed: Chest;Right Arm;Left Arm;Abdomen;Front perineal area;Buttocks;Right upper leg;Left lower leg (including foot);Right lower leg (including foot);Left upper leg Bathing: 4:  Steadying assist  FIM - Upper Body Dressing/Undressing Upper body dressing/undressing steps patient completed: Thread/unthread right sleeve of front closure shirt/dress;Pull shirt around back of front closure shirt/dress;Button/unbutton shirt Upper body dressing/undressing: 4: Min-Patient completed 75 plus % of tasks FIM - Lower Body Dressing/Undressing Lower body dressing/undressing steps patient completed: Fasten/unfasten left shoe;Fasten/unfasten right shoe;Don/Doff left shoe;Pull pants up/down;Don/Doff right shoe;Thread/unthread left pants leg;Thread/unthread right pants leg Lower body dressing/undressing: 4: Min-Patient completed 75 plus % of tasks  FIM - Toileting Toileting steps completed by patient: Adjust clothing prior to toileting;Performs perineal hygiene;Adjust clothing after toileting Toileting: 4: Steadying assist  FIM - Diplomatic Services operational officer Devices: Grab bars Toilet Transfers: 4-To toilet/BSC: Min A (steadying Pt. > 75%);4-From toilet/BSC: Min A (steadying Pt. > 75%)  FIM - Bed/Chair Transfer Bed/Chair Transfer: 7: Independent: No helper;7: Sit > Supine: No assist;4: Bed > Chair or W/C: Min A (steadying Pt. > 75%);4: Chair or W/C > Bed: Min A (steadying Pt. > 75%)  FIM - Locomotion: Wheelchair Locomotion: Wheelchair: 1: Total Assistance/staff pushes wheelchair (Pt<25%) FIM - Locomotion: Ambulation Ambulation/Gait Assistance: 4: Min assist Locomotion: Ambulation: 4: Travels 150 ft or more with minimal assistance (Pt.>75%)  Comprehension Comprehension Mode: Auditory Comprehension: 5-Follows basic conversation/direction: With no assist  Expression Expression Mode: Verbal Expression: 5-Expresses basic needs/ideas: With extra time/assistive device  Social Interaction Social Interaction: 5-Interacts appropriately 90% of the time - Needs monitoring or encouragement for participation or interaction.  Problem Solving Problem Solving: 5-Solves basic  90% of the time/requires cueing < 10% of the time  Memory Memory: 4-Recognizes or recalls 75 - 89% of the time/requires cueing 10 - 24% of the time  2. Anticoagulation/DVT prophylaxis with Pharmaceutical: Lovenox 3.  Neurogenic bladder [facility] oxybutnin 4.  Spasticity baclofen trial  LOS (Days) 2 A FACE TO FACE EVALUATION WAS PERFORMED  Hayven Croy E 08/16/2011, 8:42 AM

## 2011-08-16 NOTE — Progress Notes (Signed)
Physical Therapy Note  Patient Details  Name: Jeanette Estrada MRN: [id number] Date of Birth: 1975/06/15 Today's Date: 08/16/2011  14:30-15:10 individual therapy pt denied pain. Performed rw safety in the kitchen to gather plates from cabinet. Attempted to find rollator walker without success to allow pt to perform house hold task, carry plates, and to sit. This was discussed with OT and she agreed pt could benefit. A rollator was ordered to allow pt to be taught how to use safely. Pt over all min assist but limited by rw. Pt was educated on energy conservation technique for housework and community access.   Julian Reil 08/16/2011, 4:01 PM

## 2011-08-16 NOTE — Progress Notes (Signed)
Speech Language Pathology Daily Session Note  Patient Details  Name: Jeanette Estrada MRN: [id number] Date of Birth: Oct 21, 1975  Today's Date: 08/16/2011 Time: 0925-1010 Time Calculation (min): 45 min  Short Term Goals:  SLP Short Term Goal 1 (Week 1): Pt will demonstrate functional problem solving for complex tasks with Mod I SLP Short Term Goal 2 (Week 1): Pt will utlize external memory aids to increase recall and carryover of newly learned information with Mod I. SLP Short Term Goal 3 (Week 1): Pt will demonstrate alternating attention in a mildly distracting enviornment with Mod I.. SLP Short Term Goal 4 (Week 1): Pt will self-monitor and correct verbosity during functional conversation with Mod I  Skilled Therapeutic Interventions: Treatment focus on emergent awareness and complex problem solving. Pt required Min semantic cues to self-monitor and correct verbosity throughout functional tasks and for redirection to task. Pt's overall verbosity and attention improved by end of session. Pt recalled functions for current medications by independently utilizing the memory compensatory strategy of association. Pt demonstrated problem solving by reporting she would like to utilize a pill box at discharge to improve organization and recall with medications.    Daily Session Precautions/Restrictions  Restrictions Weight Bearing Restrictions: No FIM:  Comprehension Comprehension Mode: Auditory Comprehension: 5-Understands complex 90% of the time/Cues < 10% of the time Expression Expression: 5-Expresses complex 90% of the time/cues < 10% of the time Social Interaction Social Interaction: 5-Interacts appropriately 90% of the time - Needs monitoring or encouragement for participation or interaction. Problem Solving Problem Solving: 5-Solves complex 90% of the time/cues < 10% of the time Memory Memory: 5-Recognizes or recalls 90% of the time/requires cueing < 10% of the time Pain No/Denies  Pain  Therapy/Group: Individual Therapy  [name] 08/16/2011, 10:35 AM

## 2011-08-16 NOTE — Progress Notes (Signed)
Occupational Therapy Session Note  Patient Details  Name: Jeanette Estrada MRN: [id number] Date of Birth: [birth year]  Today's Date: 08/16/2011 Time: 0730-0840 Time Calculation (min): 70 min  Skilled Therapeutic Interventions/Progress Updates:    Pt seen today for individual OT treatment session for ADL retraining in ADL apartment w/ focus on bathe/dressing and functional mobility transfers into/out of tub & showering. Overall, pt requires increased time for all ADL tasks. She was Min A into/out of tub, and Min/SBA sit to stand in shower using grab bars, secondary to ataxia LE's & for safety. Pt was Min-steady assist UE/LE dressing seated. Continue toward POC and goals for OT.  Therapy Documentation Precautions:  Precautions Precautions: Fall Precaution Comments: LE shake in weight bearing-BAclofen to begin as treatment Restrictions Weight Bearing Restrictions: No   Pain: No c/o pain; w/o pain         See FIM for current functional status  Therapy/Group: Individual Therapy  Alm Bustard 08/16/2011, 1:25 PM

## 2011-08-16 NOTE — Progress Notes (Signed)
Social Work Patient ID: Jeanette Estrada, female   DOB: [birth year], 36 y.o.   MRN: [id number] Spoke with Arline Asp Rochelle-Financial Counselor who reports she came to pt's room and took Medicaid application. Pt doing better today and encouraged by therapies.  Aware she will be short length of stay, but doesn't want to leave too soon. Will work toward discharge and find out DME and follow up needs.

## 2011-08-17 MED ORDER — BACLOFEN 5 MG HALF TABLET
5.0000 mg | ORAL_TABLET | Freq: Four times a day (QID) | ORAL | Status: DC
  Administered 2011-08-17 – 2011-08-19 (×8): 5 mg via ORAL
  Filled 2011-08-17 (×12): qty 1

## 2011-08-17 NOTE — Progress Notes (Signed)
Occupational Therapy Note  Patient Details  Name: Jeanette Estrada MRN: [id number] Date of Birth: Feb 16, 1976 Today's Date: 08/17/2011 Time:  1100-1210  (70 min) Pain:  None Individual therapy  Engaged in functional mobility, balance, walker safety, energy conservation techniques.  Pt. Performed activities with bed making, and simulated cooking activity ( shortened due to decreased time).  Pt demonstrated increased time for activities, but showed good safety awareness after instruction.  Pt's daughter present during session (4 yo).  Daug  Wanted to assist Mom, but OT asked for her to let Mom do the work.   Humberto Seals 08/17/2011, 6:29 PM

## 2011-08-17 NOTE — Progress Notes (Signed)
Patient ID: Jeanette Estrada, female   DOB: November 20, 1975, 36 y.o.   MRN: [id number]  Subjective/Complaints: 36 year old right-handed female admitted 08/12/2011 with progressive weakness in her extremities of the past few years with with excessive tremulousness. She states that she had never had a formal workup for multiple sclerosis. Patient on no formal medications prior to admission. She states she can barely walk at times and uses a cane as well as some slurred speech. MRI of the brain showed widespread chronic appearing white matter disease with a pattern typical of chronic multiple sclerosis. MRI cervical spine with numerous areas of abnormal T2 signal within the cervical spinal cord consistent with multiple sclerosis involvement of the cervical spine. Neurology consulted placed on high dose intravenous Solu-Medrol x5 days for MS exacerbation. Urine drug screen on admission was positive for marijuana. Subcutaneous Lovenox added for DVT prophylaxis  Pt still shaking with standing Objective: Vital Signs: Blood pressure 103/64, pulse 78, temperature 98.1 F (36.7 C), temperature source Oral, resp. rate 18, SpO2 98.00%. No results found. Results for orders placed during the hospital encounter of 08/14/11 (from the past 72 hour(s))  CBC     Status: Abnormal   Collection Time   08/15/11  6:35 AM      Component Value Range Comment   WBC 13.0 (*) 4.0 - 10.5 (K/uL)    RBC 3.81 (*) 3.87 - 5.11 (MIL/uL)    Hemoglobin 11.4 (*) 12.0 - 15.0 (g/dL)    HCT 78.2 (*) 95.6 - 46.0 (%)    MCV 89.0  78.0 - 100.0 (fL)    MCH 29.9  26.0 - 34.0 (pg)    MCHC 33.6  30.0 - 36.0 (g/dL)    RDW 21.3  08.6 - 57.8 (%)    Platelets 193  150 - 400 (K/uL)   COMPREHENSIVE METABOLIC PANEL     Status: Abnormal   Collection Time   08/15/11  6:35 AM      Component Value Range Comment   Sodium 140  135 - 145 (mEq/L)    Potassium 3.9  3.5 - 5.1 (mEq/L)    Chloride 103  96 - 112 (mEq/L)    CO2 25  19 - 32 (mEq/L)    Glucose, Bld  116 (*) 70 - 99 (mg/dL)    BUN 13  6 - 23 (mg/dL)    Creatinine, Ser 4.69  0.50 - 1.10 (mg/dL)    Calcium 9.2  8.4 - 10.5 (mg/dL)    Total Protein 6.6  6.0 - 8.3 (g/dL)    Albumin 3.7  3.5 - 5.2 (g/dL)    AST 30  0 - 37 (U/L)    ALT 62 (*) 0 - 35 (U/L)    Alkaline Phosphatase 64  39 - 117 (U/L)    Total Bilirubin 0.2 (*) 0.3 - 1.2 (mg/dL)    GFR calc non Af Amer >90  >90 (mL/min)    GFR calc Af Amer >90  >90 (mL/min)   DIFFERENTIAL     Status: Abnormal   Collection Time   08/15/11  6:35 AM      Component Value Range Comment   Neutrophils Relative 92 (*) 43 - 77 (%)    Neutro Abs 12.0 (*) 1.7 - 7.7 (K/uL)    Lymphocytes Relative 7 (*) 12 - 46 (%)    Lymphs Abs 0.9  0.7 - 4.0 (K/uL)    Monocytes Relative 1 (*) 3 - 12 (%)    Monocytes Absolute 0.1  0.1 - 1.0 (K/uL)  Eosinophils Relative 0  0 - 5 (%)    Eosinophils Absolute 0.0  0.0 - 0.7 (K/uL)    Basophils Relative 0  0 - 1 (%)    Basophils Absolute 0.0  0.0 - 0.1 (K/uL)      HEENT: normal Cardio: RRR Resp: CTA B/L GI: BS positive Extremity:  No Edema Skin:   Intact Neuro: Alert/Oriented, Abnormal Sensory reduced lt touch in feet, Abnormal Motor 3-/5 on Left and 4/5 on R, Reflexes: 3+ and Dysarthric Musc/Skel:  Normal   Assessment/Plan: 1. Functional deficits secondary to Multiple sclerosis causing Left hemiparesis, neurogenic bladder,spasticity newly diagnosed which require 3+ hours per day of interdisciplinary therapy in a comprehensive inpatient rehab setting. Physiatrist is providing close team supervision and 24 hour management of active medical problems listed below. Physiatrist and rehab team continue to assess barriers to discharge/monitor patient progress toward functional and medical goals. FIM: FIM - Bathing Bathing Steps Patient Completed: Chest;Right Arm;Left Arm;Abdomen;Front perineal area;Buttocks;Right upper leg;Left upper leg Bathing: 4: Steadying assist  FIM - Upper Body Dressing/Undressing Upper body  dressing/undressing steps patient completed: Thread/unthread right bra strap;Thread/unthread left bra strap;Hook/unhook bra;Thread/unthread right sleeve of front closure shirt/dress;Thread/unthread left sleeve of front closure shirt/dress;Button/unbutton shirt Upper body dressing/undressing: 4: Min-Patient completed 75 plus % of tasks FIM - Lower Body Dressing/Undressing Lower body dressing/undressing steps patient completed: Thread/unthread right pants leg;Thread/unthread left pants leg;Pull pants up/down;Don/Doff right shoe;Don/Doff left shoe Lower body dressing/undressing: 4: Min-Patient completed 75 plus % of tasks  FIM - Toileting Toileting steps completed by patient: Adjust clothing prior to toileting Toileting Assistive Devices: Grab bar or rail for support Toileting: 4: Steadying assist  FIM - Diplomatic Services operational officer Devices: Bedside commode;Walker (3:1 over toilet, ambulating w/ RW) Toilet Transfers: 4-To toilet/BSC: Min A (steadying Pt. > 75%);4-From toilet/BSC: Min A (steadying Pt. > 75%)  FIM - Bed/Chair Transfer Bed/Chair Transfer Assistive Devices: Therapist, occupational: 4: Bed > Chair or W/C: Min A (steadying Pt. > 75%);4: Chair or W/C > Bed: Min A (steadying Pt. > 75%)  FIM - Locomotion: Wheelchair Locomotion: Wheelchair: 0: Activity did not occur FIM - Locomotion: Ambulation Locomotion: Ambulation Assistive Devices: Designer, industrial/product Ambulation/Gait Assistance: 4: Min guard Locomotion: Ambulation: 4: Travels 150 ft or more with minimal assistance (Pt.>75%)  Comprehension Comprehension Mode: Auditory Comprehension: 5-Understands basic 90% of the time/requires cueing < 10% of the time  Expression Expression Mode: Verbal Expression: 5-Expresses complex 90% of the time/cues < 10% of the time  Social Interaction Social Interaction: 5-Interacts appropriately 90% of the time - Needs monitoring or encouragement for participation or  interaction.  Problem Solving Problem Solving: 5-Solves complex 90% of the time/cues < 10% of the time  Memory Memory: 5-Recognizes or recalls 90% of the time/requires cueing < 10% of the time  2. Anticoagulation/DVT prophylaxis with Pharmaceutical: Lovenox 3.  Neurogenic bladder [facility]MN oxybutnin 4.  Spasticity baclofen trial increase to QID  LOS (Days) 3 A FACE TO FACE EVALUATION WAS PERFORMED  Annmarie Plemmons E 08/17/2011, 10:04 AM

## 2011-08-17 NOTE — Progress Notes (Signed)
Speech Language Pathology Daily Session Note  Patient Details  Name: Jeanette Estrada MRN: [id number] Date of Birth: 1975-12-27  Today's Date: 08/17/2011 Time: 4098-1191 Time Calculation (min): 45 min  Short Term Goals: Week 1:  SLP Short Term Goal 1 (Week 1): Pt will demonstrate functional problem solving for complex tasks with Mod I SLP Short Term Goal 1 - Progress (Week 1): Progressing toward goal SLP Short Term Goal 2 (Week 1): Pt will utlize external memory aids to increase recall and carryover of newly learned information with Mod I. SLP Short Term Goal 2 - Progress (Week 1): Progressing toward goal SLP Short Term Goal 3 (Week 1): Pt will demonstrate alternating attention in a mildly distracting enviornment with Mod I.. SLP Short Term Goal 3 - Progress (Week 1): Progressing toward goal SLP Short Term Goal 4 (Week 1): Pt will self-monitor and correct verbosity during functional conversation with Mod I SLP Short Term Goal 4 - Progress (Week 1): Progressing toward goal  Skilled Therapeutic Interventions:  Treatment focused on utilization of cognitive compensatory strategies during 2 different scheduling tasks:  Convergent and divergent.  Patient independently and successfully used self-talk, use of visual guide (index finger) to track her place in a busy visual written field, asked for clarification of instructions, and was able to selectively attend in a fairly distracting environment (e.g., daughter watching tv, SLP talking to daughter, interruptions by nursing & MD).  However, patient required minimal assist level cues, verbally & directly, to identify verbosity and cease topic.  This appears to be her most impacting functional deficit as she is not self aware of how it impacts and distracts her.  Both SLP and MD had to directly redirect her to MD's assessment.  Minimal verbal prompts with alternating attention in scheduling task with 80% accuracy.  Required max verbal/semantic cues for  comprehension of a complex written question, as patient appeared perseverative on her idea of the question and unable to understand explanations/reasoning related to it's content.  Continue with POC.  FIM:  Comprehension Comprehension Mode: Auditory Comprehension: 5-Understands complex 90% of the time/Cues < 10% of the time Expression Expression Mode: Verbal Expression: 5-Expresses complex 90% of the time/cues < 10% of the time Social Interaction Social Interaction: 5-Interacts appropriately 90% of the time - Needs monitoring or encouragement for participation or interaction. (verbose) Problem Solving Problem Solving: 5-Solves complex 90% of the time/cues < 10% of the time Memory Memory: 5-Recognizes or recalls 90% of the time/requires cueing < 10% of the time  Pain Pain Assessment Pain Assessment: No/denies pain  Therapy/Group: Individual Therapy  Myra Rude, M.S.,CCC-SLP Pager [phone] 08/17/2011, 11:04 AM

## 2011-08-17 NOTE — Progress Notes (Signed)
Physical Therapy Note  Patient Details  Name: Jeanette Estrada MRN: [id number] Date of Birth: 04/12/1975 Today's Date: 08/17/2011  1540-1630 (50 minutes) individual Pain : no complaint of pain Focus of treatment: gait training/endurance; therapeutic exercises bilateral LEs /trunk Treatment: Gait to/from gym min assist secondary to bilateral LE spasms ( 120 feet); Nustep Level 4 X 10 minutes with 2 short rest breaks; bilateral SLRs X 10 with minimal quad lag on left; bilateral heel slides X 15; sidelying bilateral hip abduction X 10; bridging in supine using therapy ball X 10 , quadraped hand and leg lifts with decreased trunk stability.   Mazzie Brodrick,JIM 08/17/2011, 4:38 PM

## 2011-08-17 NOTE — Progress Notes (Signed)
Occupational Therapy Session Note  Patient Details  Name: Tria Noguera MRN: [id number] Date of Birth: 12/18/1975  Today's Date: 08/17/2011 Time: 1300-1400 Time Calculation (min): 60 min  Skilled Therapeutic Interventions/Progress Updates: TAG group with focus on UE strengthening and standing balance and endurance    Therapy Documentation Precautions:  Precautions Precautions: Fall Precaution Comments: LE shake in weight bearing-BAclofen to begin as treatment Restrictions Weight Bearing Restrictions: No  Pain: no c/os  See FIM for current functional status  Therapy/Group: Group Therapy  Rozelle Logan 08/17/2011, 6:25 PM

## 2011-08-18 NOTE — Progress Notes (Signed)
Patient ID: Jeanette Estrada, female   DOB: 1975-08-30, 36 y.o.   MRN: [id number]  Subjective/Complaints: 36 year old right-handed female admitted 08/12/2011 with progressive weakness in her extremities of the past few years with with excessive tremulousness. She states that she had never had a formal workup for multiple sclerosis. Patient on no formal medications prior to admission. She states she can barely walk at times and uses a cane as well as some slurred speech. MRI of the brain showed widespread chronic appearing white matter disease with a pattern typical of chronic multiple sclerosis. MRI cervical spine with numerous areas of abnormal T2 signal within the cervical spinal cord consistent with multiple sclerosis involvement of the cervical spine. Neurology consulted placed on high dose intravenous Solu-Medrol x5 days for MS exacerbation. Urine drug screen on admission was positive for marijuana. Subcutaneous Lovenox added for DVT prophylaxis  Pt still shaking with standing Objective: Vital Signs: Blood pressure 99/65, pulse 75, temperature 97.8 F (36.6 C), temperature source Oral, resp. rate 16, SpO2 98.00%. No results found. No results found for this or any previous visit (from the past 72 hour(s)).   HEENT: normal Cardio: RRR Resp: CTA B/L GI: BS positive Extremity:  No Edema Skin:   Intact Neuro: Alert/Oriented, Abnormal Sensory reduced lt touch in feet, Abnormal Motor 3-/5 on Left and 4/5 on R, Reflexes: 3+ and Dysarthric Musc/Skel:  Normal Cerebellar FNF intact LE strength now 4/5 +truncal ataxia Assessment/Plan: 1. Functional deficits secondary to Multiple sclerosis causing Left hemiparesis, neurogenic bladder,spasticity newly diagnosed which require 3+ hours per day of interdisciplinary therapy in a comprehensive inpatient rehab setting. Physiatrist is providing close team supervision and 24 hour management of active medical problems listed below. Physiatrist and rehab team  continue to assess barriers to discharge/monitor patient progress toward functional and medical goals. FIM: FIM - Bathing Bathing Steps Patient Completed: Chest;Right Arm;Left Arm;Abdomen;Front perineal area;Buttocks;Right upper leg;Left upper leg Bathing: 4: Steadying assist  FIM - Upper Body Dressing/Undressing Upper body dressing/undressing steps patient completed: Thread/unthread right bra strap;Thread/unthread left bra strap;Hook/unhook bra;Thread/unthread right sleeve of front closure shirt/dress;Thread/unthread left sleeve of front closure shirt/dress;Button/unbutton shirt Upper body dressing/undressing: 4: Min-Patient completed 75 plus % of tasks FIM - Lower Body Dressing/Undressing Lower body dressing/undressing steps patient completed: Thread/unthread right pants leg;Thread/unthread left pants leg;Pull pants up/down;Don/Doff right shoe;Don/Doff left shoe Lower body dressing/undressing: 4: Min-Patient completed 75 plus % of tasks  FIM - Toileting Toileting steps completed by patient: Adjust clothing prior to toileting Toileting Assistive Devices: Grab bar or rail for support Toileting: 4: Steadying assist  FIM - Diplomatic Services operational officer Devices: Bedside commode;Walker (3:1 over toilet, ambulating w/ RW) Toilet Transfers: 4-To toilet/BSC: Min A (steadying Pt. > 75%);4-From toilet/BSC: Min A (steadying Pt. > 75%)  FIM - Bed/Chair Transfer Bed/Chair Transfer Assistive Devices: Therapist, occupational: 4: Bed > Chair or W/C: Min A (steadying Pt. > 75%);4: Chair or W/C > Bed: Min A (steadying Pt. > 75%)  FIM - Locomotion: Wheelchair Locomotion: Wheelchair: 0: Activity did not occur FIM - Locomotion: Ambulation Locomotion: Ambulation Assistive Devices: Designer, industrial/product Ambulation/Gait Assistance: 4: Min guard Locomotion: Ambulation: 4: Travels 150 ft or more with minimal assistance (Pt.>75%)  Comprehension Comprehension Mode: Auditory Comprehension:  5-Understands complex 90% of the time/Cues < 10% of the time  Expression Expression Mode: Verbal Expression: 5-Expresses complex 90% of the time/cues < 10% of the time  Social Interaction Social Interaction: 5-Interacts appropriately 90% of the time - Needs monitoring or encouragement for participation or interaction. (  verbose)  Problem Solving Problem Solving: 5-Solves complex 90% of the time/cues < 10% of the time  Memory Memory: 5-Recognizes or recalls 90% of the time/requires cueing < 10% of the time  2. Anticoagulation/DVT prophylaxis with Pharmaceutical: Lovenox 3.  Neurogenic bladder UMN oxybutnin 4.  Spasticity baclofen trial increase to QID  LOS (Days) 4 A FACE TO FACE EVALUATION WAS PERFORMED  Jeanette Estrada E 08/18/2011, 8:54 AM

## 2011-08-18 NOTE — Progress Notes (Signed)
Physical Therapy Note  Patient Details  Name: Emmalynn Pinkham MRN: [id number] Date of Birth: Aug 11, 1975 Today's Date: 08/18/2011  1100-1155 (55 minutes) group Pain: no complaint of pain Pt participated in PT group session focusing on gait training/safety/endurance Treatment: Pt ambulates to/from room to gym (120) feet min to close SBA with RW; quadraped single and alternate arm/leg lifts X 5; tall kneeling, bridging using therapy ball, hip flexion/extension using therapy ball, alternate step up to 6 inch step (quad strengthening) with RW support X 15.   Latorya Bautch,JIM 08/18/2011, 12:39 PM

## 2011-08-19 DIAGNOSIS — R279 Unspecified lack of coordination: Secondary | ICD-10-CM

## 2011-08-19 DIAGNOSIS — Z5189 Encounter for other specified aftercare: Secondary | ICD-10-CM

## 2011-08-19 DIAGNOSIS — G834 Cauda equina syndrome: Secondary | ICD-10-CM

## 2011-08-19 DIAGNOSIS — G35 Multiple sclerosis: Secondary | ICD-10-CM

## 2011-08-19 MED ORDER — BACLOFEN 10 MG PO TABS
10.0000 mg | ORAL_TABLET | Freq: Three times a day (TID) | ORAL | Status: DC
  Administered 2011-08-19 – 2011-08-20 (×3): 10 mg via ORAL
  Filled 2011-08-19 (×6): qty 1

## 2011-08-19 NOTE — Progress Notes (Signed)
Occupational Therapy Session Note  Patient Details  Name: Idora Brosious MRN: [id number] Date of Birth: 12-09-1975  Today's Date: 08/19/2011 Time: 0820-0905 Time Calculation (min): 45 min   Skilled Therapeutic Interventions/Progress Updates: Patient seen for 1:1 OT this am.  patient reports showering and dressing herself this am.  without assistance.  Therapy session with emphasis on discussion regarding disease and symptom management, energy conservation, and functional use of rollator.  Patient able to transport items within room, and manage doorways/doors with this new device.  Discussed discharge plan potentially for 08/20/2011.  Explained that it would be helpful to have family here to educate regarding patient's current level of functioning.      Therapy Documentation Precautions:  Precautions Precautions: Fall Precaution Comments: LE shake in weight bearing-BAclofen to begin as treatment Restrictions Weight Bearing Restrictions: No  Pain:  No report of pain    See FIM for current functional status  Therapy/Group: Individual Therapy  Collier Salina 08/19/2011, 12:21 PM

## 2011-08-19 NOTE — Progress Notes (Signed)
Physical Therapy Note  Patient Details  Name: Jeanette Estrada MRN: [id number] Date of Birth: [birth year] Today's Date: 08/19/2011  1400-1500 (60 minutes) group Pain: no complaint of pain Pt participated in PT group session focused on gait safety, endurance, training Treatment: Pt ambulates room >< gym with 4 wheeled RW SBA for safety only secondary to bilateral LE tremors; Pt participated in ball toss activity in group setting in tall kneeling or standing without AD close SBA with a word recall activity (cognitive challenge) .  Davin Muramoto,JIM 08/19/2011, 3:15 PM

## 2011-08-19 NOTE — Progress Notes (Signed)
Social Work Patient ID: Jeanette Estrada, female   DOB: 1975/06/13, 36 y.o.   MRN: [id number] Met with team who feels pt is ready for discharge tomorrow, contacted MD who reports no medical issues and okayed for  Discharge tomorrow.  Sister here to attend therapies, both informed of readiness for discharge.  Will make discharge Arrangements, DME and Home Health.  Both feel comfortable with discharge tomorrow.

## 2011-08-19 NOTE — Progress Notes (Signed)
Patient ID: Jeanette Estrada, female   DOB: [birth year], 36 y.o.   MRN: [id number]    Subjective/Complaints: 36 year old right-handed female admitted 08/12/2011 with progressive weakness in her extremities of the past few years with with excessive tremulousness. She states that she had never had a formal workup for multiple sclerosis. Patient on no formal medications prior to admission. She states she can barely walk at times and uses a cane as well as some slurred speech. MRI of the brain showed widespread chronic appearing white matter disease with a pattern typical of chronic multiple sclerosis. MRI cervical spine with numerous areas of abnormal T2 signal within the cervical spinal cord consistent with multiple sclerosis involvement of the cervical spine. Neurology consulted placed on high dose intravenous Solu-Medrol x5 days for MS exacerbation. Urine drug screen on admission was positive for marijuana. Subcutaneous Lovenox added for DVT prophylaxis  Pt still shaking with standing Objective: Vital Signs: Blood pressure 105/70, pulse 65, temperature 98.4 F (36.9 C), temperature source Oral, resp. rate 18, SpO2 99.00%. No results found. No results found for this or any previous visit (from the past 72 hour(s)).   HEENT: normal Cardio: RRR Resp: CTA B/L GI: BS positive Extremity:  No Edema Skin:   Intact Neuro: Alert/Oriented, Abnormal Sensory reduced lt touch in feet, Abnormal Motor 3-/5 on Left and 4/5 on R, Reflexes: 3+ and Dysarthric Musc/Skel:  Normal Cerebellar FNF intact LE strength now 4/5 +truncal ataxia Assessment/Plan: 1. Functional deficits secondary to Multiple sclerosis causing Left hemiparesis, neurogenic bladder,spasticity from therapy standpoint may be ready for D/C in am.  Will need neuro and PMR f/u as outpt      FIM: FIM - Bathing Bathing Steps Patient Completed: Chest;Right Arm;Left Arm;Abdomen;Front perineal area;Buttocks;Right upper leg;Left upper leg Bathing: 4:  Steadying assist  FIM - Upper Body Dressing/Undressing Upper body dressing/undressing steps patient completed: Thread/unthread right bra strap;Thread/unthread left bra strap;Hook/unhook bra;Thread/unthread right sleeve of front closure shirt/dress;Thread/unthread left sleeve of front closure shirt/dress;Button/unbutton shirt Upper body dressing/undressing: 4: Min-Patient completed 75 plus % of tasks FIM - Lower Body Dressing/Undressing Lower body dressing/undressing steps patient completed: Thread/unthread right pants leg;Thread/unthread left pants leg;Pull pants up/down;Don/Doff right shoe;Don/Doff left shoe Lower body dressing/undressing: 4: Min-Patient completed 75 plus % of tasks  FIM - Toileting Toileting steps completed by patient: Adjust clothing prior to toileting;Performs perineal hygiene;Adjust clothing after toileting Toileting Assistive Devices: Grab bar or rail for support Toileting: 4: Steadying assist  FIM - Diplomatic Services operational officer Devices: Bedside commode;Walker (3:1 over toilet, ambulating w/ RW) Toilet Transfers: 4-To toilet/BSC: Min A (steadying Pt. > 75%);4-From toilet/BSC: Min A (steadying Pt. > 75%)  FIM - Bed/Chair Transfer Bed/Chair Transfer Assistive Devices: Therapist, occupational: 4: Bed > Chair or W/C: Min A (steadying Pt. > 75%);4: Chair or W/C > Bed: Min A (steadying Pt. > 75%)  FIM - Locomotion: Wheelchair Locomotion: Wheelchair: 0: Activity did not occur FIM - Locomotion: Ambulation Locomotion: Ambulation Assistive Devices: Designer, industrial/product Ambulation/Gait Assistance: 4: Min guard Locomotion: Ambulation: 4: Travels 150 ft or more with minimal assistance (Pt.>75%)  Comprehension Comprehension Mode: Auditory Comprehension: 5-Understands complex 90% of the time/Cues < 10% of the time  Expression Expression Mode: Verbal Expression: 5-Expresses complex 90% of the time/cues < 10% of the time  Social Interaction Social Interaction:  6-Interacts appropriately with others with medication or extra time (anti-anxiety, antidepressant).  Problem Solving Problem Solving: 5-Solves complex 90% of the time/cues < 10% of the time  Memory Memory: 6-More than reasonable amt  of time  2. Anticoagulation/DVT prophylaxis with Pharmaceutical: Lovenox 3.  Neurogenic bladder [facility] oxybutnin 4.  Spasticity baclofen trial increase to10mg  TID LOS (Days) 5 A FACE TO FACE EVALUATION WAS PERFORMED  Jeanette Estrada 08/19/2011, 8:36 AM

## 2011-08-19 NOTE — Progress Notes (Signed)
Physical Therapy Session Note  Patient Details  Name: Jeanette Estrada MRN: [id number] Date of Birth: April 23, 1975  Today's Date: 08/19/2011 Time: 4098-1191 Time Calculation (min): 43 min  Short Term Goals: Week 1:  PT Short Term Goal 1 (Week 1): LTG = STG  Skilled Therapeutic Interventions/Progress Updates:   Assessing pt for ability to be mod I with rollator. Pt was supervision with rollator in her room, apartment area, and on the unit, until making a turn to sit down in an arm chair and she lost her balance, requiring therapist to have to catch her.  Pt stated she was aware she was falling but she was not able to prevent fall.  Pt's sister was in the room upon returning.  Sister will provide 24 hr assist.  Discussed with sister how to assist pt with safe gait with rollator, sister verbalizing understanding.  See below for more details. Do not believe that pt can safely achieve mod I goals based upon current level of function, due to tremors, gait pattern and cognitive status, will revise goals to supervision level. Therapy Documentation Precautions:  Precautions Precautions: Fall Precaution Comments: LE shake in weight bearing-BAclofen to begin as treatment Restrictions Weight Bearing Restrictions: No Pain:  No pain Mobility:  Sit to stand transfers with supervision.  Stand pivot transfers with rollator with supervision except with LOB pt required max @ to recover. Locomotion :  Gait on unit, in home environment, over tile and carpet with supervision using rollator.  Note pt's LEs tremor at all times when she is weight bearing on them.  Gait pattern appears to be a stiff leg march or ballet like with feet in plantarflexion and fore foot initial contact.  4 steps with 1 rail with close supervision.  Other Treatments:   Pt with cognitive impairments, easily distracted, seems to lose her train of thought or has difficulty expressing herself (leaves pieces of the story out and therapist has to  piece it together, but pt does not seem aware). See FIM for current functional status  Therapy/Group: Individual Therapy  Georges Mouse 08/19/2011, 2:26 PM

## 2011-08-19 NOTE — Progress Notes (Signed)
Recreational Therapy Session Note  Patient Details  Name: Jeanette Estrada MRN: [id number] Date of Birth: 28-Jul-1975 Today's Date: 08/19/2011 Time:  1415-1430 Pain: no c/o Skilled Therapeutic Interventions/Progress Updates: Pt participated in ball toss/word game in tall kneeling or standing without AD close supervision  Branston Halsted 08/19/2011, 4:39 PM

## 2011-08-19 NOTE — Progress Notes (Signed)
Speech Language Pathology Daily Session Note  Patient Details  Name: Jeanette Estrada MRN: [id number] Date of Birth: May 16, 1975  Today's Date: 08/19/2011 Time: 1478-2956 Time Calculation (min): 45 min  Short Term Goals:  SLP Short Term Goal 1 (Week 1): Pt will demonstrate functional problem solving for complex tasks with Mod I SLP Short Term Goal 1 - Progress (Week 1): Progressing toward goal SLP Short Term Goal 2 (Week 1): Pt will utlize external memory aids to increase recall and carryover of newly learned information with Mod I. SLP Short Term Goal 2 - Progress (Week 1): Progressing toward goal SLP Short Term Goal 3 (Week 1): Pt will demonstrate alternating attention in a mildly distracting enviornment with Mod I.. SLP Short Term Goal 3 - Progress (Week 1): Progressing toward goal SLP Short Term Goal 4 (Week 1): Pt will self-monitor and correct verbosity during functional conversation with Mod I SLP Short Term Goal 4 - Progress (Week 4): Progressing toward goal  Skilled Therapeutic Interventions: Treatment focus on pt/family education in regards to current cognitive function and strategies to utilize at home to increase working memory, complex problem solving, attention and organization. Pt and family verbalize and demonstrate understanding.    Daily Session FIM:  Comprehension Comprehension: 5-Understands complex 90% of the time/Cues < 10% of the time Expression Expression Mode: Verbal Expression: 5-Expresses complex 90% of the time/cues < 10% of the time Social Interaction Social Interaction: 6-Interacts appropriately with others with medication or extra time (anti-anxiety, antidepressant). Problem Solving Problem Solving: 6-Solves complex problems: With extra time Memory Memory: 6-More than reasonable amt of time Pain: No/Denies Pain   Therapy/Group: Individual Therapy  Joseth Weigel 08/19/2011, 4:08 PM

## 2011-08-20 MED ORDER — BACLOFEN 10 MG PO TABS: 10.0000 mg | ORAL_TABLET | Freq: Three times a day (TID) | ORAL | Status: AC

## 2011-08-20 MED ORDER — OXYBUTYNIN CHLORIDE 5 MG PO TABS: 2.5000 mg | ORAL_TABLET | Freq: Two times a day (BID) | ORAL | Status: DC

## 2011-08-20 NOTE — Progress Notes (Signed)
Occupational Therapy Session Note  Patient Details  Name: Jeanette Estrada MRN: [id number] Date of Birth: 1975-09-05  Today's Date: 08/20/2011 Time: 0915-1000, 1200-1212 Time Calculation (min): 45 min, 12 min  Skilled Therapeutic Interventions/Progress Updates:  1)  Patient seen this am for final OT session to practice high level balance tasks in highly distractible environment, e.g. Housekeeping, laundry, and cooking skills.  Patient without loss of balance despite multiple distractions during this session.   2)  Second session:  Patient's sister in law here.  Education provided to patient and family member regarding energy conservation, need for distant supervsion during shower activity, and importance of balancing activity and rest for symptom management.  Sister in law expressing positive reaction to patient's improvement with ADL / functional mobility.      Therapy Documentation Precautions:  Precautions Precautions: Fall Precaution Comments: LE shake in weight bearing-BAclofen to begin as treatment Restrictions Weight Bearing Restrictions: No   Pain: Pain Assessment Pain Assessment: No/denies pain  See FIM for current functional status  Therapy/Group: Individual Therapy  Collier Salina 08/20/2011, 4:54 PM

## 2011-08-20 NOTE — Progress Notes (Signed)
Social Work Discharge Note Discharge Note  The overall goal for the admission was met for:   Discharge location: Yes-HOME WITH SISTER, BROTHER AND AUNT  Length of Stay: Yes-6 DAYS  Discharge activity level: Yes-SUPERVISION/MOD/I LEVEL  Home/community participation: Yes  Services provided included: MD, RD, PT, OT, SLP, RN, CM, TR, Pharmacy and SW  Financial Services: Other: PENDING MEDICAID  Follow-up services arranged: Home Health: [facility]-PT, SPT, RN, SW, DME: [facility]-ROLLATOR WALKER, TUB SEAT and Patient/Family has no preference for HH/DME agencies  Comments (or additional information):  Patient/Family verbalized understanding of follow-up arrangements: Yes  Individual responsible for coordination of the follow-up plan: PT AND DEENA-SISTER  Confirmed correct DME delivered: Lucy Chris 08/20/2011    Lucy Chris

## 2011-08-20 NOTE — Discharge Instructions (Signed)
Inpatient Rehab Discharge Instructions  [facility] Discharge date and time: No discharge date for patient encounter.   Activities/Precautions/ Functional Status: Activity: activity as tolerated Diet: regular diet Wound Care: none needed Functional status:  ___ No restrictions     ___ Walk up steps independently _x__ 24/7 supervision/assistance   __x_ Walk up steps with assistance ___ Intermittent supervision/assistance  ___ Bathe/dress independently ___ Walk with walker     ___ Bathe/dress with assistance ___ Walk Independently    ___ Shower independently __x_ Walk with assistance    ___ Shower with assistance ___ No alcohol     ___ Return to work/school ________  Special Instructions: Referred to [facility] in reference to multiple sclerosis and followup appointment for outpatient medications. [place] [facility] to call to schedule appointment [phone]    COMMUNITY REFERRALS UPON DISCHARGE:    Home Health:   PT,RN, SPT, SW    Agency:[facility] [phone] Date of last service:08/20/2011  Medical Equipment/Items Ordered:ROLLATOR WALKER, TUB SEAT  Agency/Supplier:[facility]   GENERAL COMMUNITY RESOURCES FOR PATIENT/FAMILY: Support Groups:[place] SOCIETY  GIVEN INFO TO [facility] FOR MEDICAL FOLLOW UP [phone]  My questions have been answered and I understand these instructions. I will adhere to these goals and the provided educational materials after my discharge from the hospital.  Patient/Caregiver Signature _______________________________ Date __________  Clinician Signature _______________________________________ Date __________  Please bring this form and your medication list with you to all your follow-up doctor's appointments.

## 2011-08-20 NOTE — Progress Notes (Signed)
Occupational Therapy Discharge Summary  Patient Details  Name: Jeanette Estrada MRN: [id number] Date of Birth: October 02, 1975  Today's Date: 08/20/2011 Time: 0915-1000 Time Calculation (min): 45 min  Patient has met 12 of 12 long term goals due to improved activity tolerance, improved balance, postural control, ability to compensate for deficits, improved attention, improved awareness and improved coordination.  Patient to discharge at overall Modified Independent level.  Patient's care partner is independent to provide the necessary supervision assistance at discharge.    Reasons goals not met: NA  Recommendation:  Patient will benefit from ongoing skilled OT services in home health setting to continue to advance functional skills in the area of BADL and iADL.  Equipment: Tub seat  Reasons for discharge: treatment goals met and discharge from hospital  Patient/family agrees with progress made and goals achieved: Yes  OT Discharge Precautions/Restrictions  Precautions Precautions: Fall Restrictions Weight Bearing Restrictions: No    Pain Pain Assessment Pain Assessment: No/denies pain    Vision/Perception  Vision - History Baseline Vision: Wears glasses all the time Perception Perception: Within Functional Limits Praxis Praxis: Intact  Cognition Overall Cognitive Status: Impaired Arousal/Alertness: Awake/alert Orientation Level: Oriented X4 Attention: Alternating Alternating Attention: Impaired Alternating Attention Impairment: Functional complex Memory: Impaired Memory Impairment: Decreased short term memory Decreased Short Term Memory: Functional complex Awareness: Impaired Awareness Impairment: Anticipatory impairment Problem Solving: Impaired Problem Solving Impairment: Functional complex Executive Function: Self Monitoring Self Monitoring: Impaired Self Monitoring Impairment: Functional complex Safety/Judgment: Appears intact Sensation Sensation Light  Touch: Appears Intact (upper extremities) Stereognosis: Appears Intact Hot/Cold: Appears Intact Proprioception: Appears Intact Coordination Fine Motor Movements are Fluid and Coordinated: No Motor  Motor Motor: Ataxia    Trunk/Postural Assessment  Cervical Assessment Cervical Assessment: Within Functional Limits Thoracic Assessment Thoracic Assessment: Within Functional Limits Lumbar Assessment Lumbar Assessment: Within Functional Limits Postural Control Postural Control: Deficits on evaluation  Balance Balance Balance Assessed: Yes Static Sitting Balance Static Sitting - Balance Support: Feet supported Static Sitting - Level of Assistance: 7: Independent Dynamic Sitting Balance Dynamic Sitting - Level of Assistance: 7: Independent Static Standing Balance Static Standing - Balance Support: Bilateral upper extremity supported Static Standing - Level of Assistance: 6: Modified independent (Device/Increase time) Dynamic Standing Balance Dynamic Standing - Balance Support: Right upper extremity supported Dynamic Standing - Level of Assistance: 6: Modified independent (Device/Increase time) Dynamic Standing - Balance Activities: Lateral lean/weight shifting;Forward lean/weight shifting;Reaching across midline;Other (comment) (Homemaking activities) Extremity/Trunk Assessment RUE Assessment RUE Assessment: Exceptions to [facility] LUE Assessment LUE Assessment: Exceptions to [facility]  See FIM for current functional status  Collier Salina 08/20/2011, 4:50 PM

## 2011-08-20 NOTE — Progress Notes (Signed)
Speech Language Pathology Discharge Summary  Patient Details  Name: Jeanette Estrada MRN: [id number] Date of Birth: [birth year]  Today's Date: 08/20/2011  Pt has made functional gains and has met 3 out of 3 all LTG's this admission. Currently, pt is overall Mod I-Supervision in regards to functional complex problem solving, alternating attnetion, and working Civil Service fast streamer. Pt continues to be verbose and tangential  during functional conversation and requires cueing to self-monitor and correct. Pt/family education complete.   Reasons goals not met: N/A  Recommendation: Patient will benefit from ongoing skilled SLP services in home health setting to maximize cognitive function and overall independence.  Equipment: N/A  Reasons for discharge: treatment goals met and discharge from hospital  Patient/family agrees with progress made and goals achieved: Yes  See FIM for current functional status  Kemal Amores 08/20/2011, 10:21 AM

## 2011-08-20 NOTE — Progress Notes (Signed)
Patient walked down with staff to be discharged with sister aprox 1245 to home. Reviewed discharge teachings with patient, no further questions at time.

## 2011-08-20 NOTE — Progress Notes (Signed)
Patient ID: Jeanette Estrada, female   DOB: [birth year], 36 y.o.   MRN: [id number]    Subjective/Complaints: 36 year old right-handed female admitted 08/12/2011 with progressive weakness in her extremities of the past few years with with excessive tremulousness. She states that she had never had a formal workup for multiple sclerosis. Patient on no formal medications prior to admission. She states she can barely walk at times and uses a cane as well as some slurred speech. MRI of the brain showed widespread chronic appearing white matter disease with a pattern typical of chronic multiple sclerosis. MRI cervical spine with numerous areas of abnormal T2 signal within the cervical spinal cord consistent with multiple sclerosis involvement of the cervical spine. Neurology consulted placed on high dose intravenous Solu-Medrol x5 days for MS exacerbation. Urine drug screen on admission was positive for marijuana. Subcutaneous Lovenox added for DVT prophylaxis  Pt still shaking with standing Objective: Vital Signs: Blood pressure 99/64, pulse 68, temperature 98.7 F (37.1 C), temperature source Oral, resp. rate 17, SpO2 100.00%. No results found. No results found for this or any previous visit (from the past 72 hour(s)).   HEENT: normal Cardio: RRR Resp: CTA B/L GI: BS positive Extremity:  No Edema Skin:   Intact Neuro: Alert/Oriented, Abnormal Sensory reduced lt touch in feet, Abnormal Motor 3-/5 on Left and 4/5 on R, Reflexes: 3+ and Dysarthric Musc/Skel:  Normal Cerebellar FNF intact LE strength now 4/5 +truncal ataxia Assessment/Plan: 1. Functional deficits secondary to Multiple sclerosis stable for D/C.  Will need neuro and PMR f/u as outpt      FIM: FIM - Bathing Bathing Steps Patient Completed: Chest;Right Arm;Left Arm;Abdomen;Front perineal area;Buttocks;Right upper leg;Left upper leg Bathing: 4: Steadying assist  FIM - Upper Body Dressing/Undressing Upper body dressing/undressing steps  patient completed: Thread/unthread right bra strap;Thread/unthread left bra strap;Hook/unhook bra;Thread/unthread right sleeve of front closure shirt/dress;Thread/unthread left sleeve of front closure shirt/dress;Button/unbutton shirt Upper body dressing/undressing: 4: Min-Patient completed 75 plus % of tasks FIM - Lower Body Dressing/Undressing Lower body dressing/undressing steps patient completed: Thread/unthread right pants leg;Thread/unthread left pants leg;Pull pants up/down;Don/Doff right shoe;Don/Doff left shoe Lower body dressing/undressing: 4: Min-Patient completed 75 plus % of tasks  FIM - Toileting Toileting steps completed by patient: Adjust clothing prior to toileting Toileting Assistive Devices: Grab bar or rail for support Toileting: 6: More than reasonable amount of time  FIM - Diplomatic Services operational officer Devices: Bedside commode;Walker (3:1 over toilet, ambulating w/ RW) Toilet Transfers: 5-To toilet/BSC: Supervision (verbal cues/safety issues);5-From toilet/BSC: Supervision (verbal cues/safety issues)  FIM - Banker Devices: Therapist, occupational: 5: Supine > Sit: Supervision (verbal cues/safety issues);5: Sit > Supine: Supervision (verbal cues/safety issues);5: Bed > Chair or W/C: Supervision (verbal cues/safety issues);5: Chair or W/C > Bed: Supervision (verbal cues/safety issues)  FIM - Locomotion: Wheelchair Locomotion: Wheelchair: 0: Activity did not occur FIM - Locomotion: Ambulation Locomotion: Ambulation Assistive Devices: Other (comment) (rollator) Ambulation/Gait Assistance: 5: Supervision Locomotion: Ambulation: 5: Travels 150 ft or more with supervision/safety issues  Comprehension Comprehension Mode: Auditory Comprehension: 5-Understands complex 90% of the time/Cues < 10% of the time  Expression Expression Mode: Verbal Expression: 5-Expresses complex 90% of the time/cues < 10% of the  time  Social Interaction Social Interaction: 6-Interacts appropriately with others with medication or extra time (anti-anxiety, antidepressant).  Problem Solving Problem Solving: 5-Solves complex 90% of the time/cues < 10% of the time  Memory Memory: 6-More than reasonable amt of time  2. Anticoagulation/DVT prophylaxis with Pharmaceutical: Lovenox  3.  Neurogenic bladder [facility] oxybutnin 4.  Spasticity baclofen trial increase to10mg  TID LOS (Days) 6 A FACE TO FACE EVALUATION WAS PERFORMED  Draiden Mirsky E 08/20/2011, 8:47 AM

## 2011-08-20 NOTE — Discharge Summary (Signed)
  Discharge summary job [phone]

## 2011-08-20 NOTE — Discharge Summary (Signed)
Jeanette Estrada, Jeanette Estrada              ACCOUNT NO.:  [id number]  MEDICAL RECORD NO.:  [id number]  LOCATION:  4153                         FACILITY:  [facility]  PHYSICIAN:  Erick Colace, M.D.DATE OF BIRTH:  31-Oct-1975  DATE OF ADMISSION:  08/14/2011 DATE OF DISCHARGE:  08/20/2011                              DISCHARGE SUMMARY   DISCHARGE DIAGNOSES: 1. Multiple sclerosis exacerbation. 2. Subcutaneous Lovenox for deep vein thrombosis prophylaxis. 3. Urine drug screen positive for marijuana.  This is a 36 year old right-handed female admitted on Aug 12, 2011, with progressive weakness in her extremities over the past few years with excessive tremulousness.  She states she had never had a formal workup for multiple sclerosis.  The patient on no formal medications prior to admission.  She states she can barely walk at times and uses a cane as well as some slurred speech.  MRI of the brain showed widespread chronic- appearing white matter disease with a pattern typical of chronic multiple sclerosis.  MRI of cervical spine with numerous areas of abnormal T2 signal within the cervical spinal cord, consistent with multiple sclerosis and involvement of the spinal spine.  Neurology Service was consulted, placed on intravenous Solu-Medrol x5 days for MS exacerbation.  Urine drug screen was positive for marijuana. Subcutaneous Lovenox added for DVT prophylaxis.  The patient was admitted for comprehensive rehab program.  PAST MEDICAL HISTORY:  See discharge diagnoses.  SOCIAL HISTORY:  Lives with family, two-level home, the patient lives in the basement.  Functional history prior to admission was independent driving.  The patient states she had many falls frequently used a motorized scooter at stores.  Functional status upon admission to [facility], ambulating minimal assist 120 feet with a rolling walker.  PHYSICAL EXAMINATION:  VITAL SIGNS:  Blood pressure 128/70, pulse 80, respirations  17, temperature 97.7. GENERAL:  This was an alert female, in no acute distress, oriented x3. LUNGS:  Clear to auscultation. CARDIAC:  Regular rate and rhythm. ABDOMEN:  Soft, nontender.  Good bowel sounds. NEUROLOGIC:  Speech was a bit slow to respond, but fully intelligible. She had fair insight of her deficits.  Memory was good. MUSCULOSKELETAL:  Strength grossly graded 3+ to 4 out of 5 throughout. She did have some motor apraxia.  REHABILITATION HOSPITAL COURSE:  The patient was admitted to [facility]patient [facility]hab [facility] with therapies initiated on a 3-hour daily basis consisting of physical therapy, occupational therapy, speech therapy, and rehabilitation nursing.  The following issues were addressed during the patient's rehabilitation stay.  Pertaining to [name]'s MS exacerbation, she completed a 5-day course of Solu-Medrol.  Baclofen had been added 10 mg 3 times daily for some increased spasms with good results.  She remained on subcutaneous Lovenox for DVT prophylaxis throughout her rehab course.  She had no bowel or bladder disturbances other than some mild frequency.  Urinalysis negative.  She was placed on Ditropan.  The patient received weekly collaborative interdisciplinary team conferences to discuss estimated length of stay, family teaching, and any barriers to her discharge.  She was ambulating extended distances with a walker, still some motor apraxia, but greatly improved. Modified independent for activities of daily living, needing some assistance for lower  body dressing and tying her shoes.  Full family teaching was completed and plan is to be discharged to home.  It was recommended that up on patient's discharge and recent completion of her Solu-Medrol, she would need follow up with Neurology Services for beginning of any medication as needed for her multiple sclerosis.  DISCHARGE MEDICATIONS: 1. Baclofen 10 mg t.i.d. 2. Ditropan 2.5 mg b.i.d. 3. Tylenol as  needed.  DIET:  Regular.  SPECIAL INSTRUCTIONS:  Continue therapies to promote overall mobility and well-being as advised per [employer].  The patient should follow up with Dr. Thana Farr, Neurology Services for recommendations associated with her multiple sclerosis.  Follow up with Dr[facility] Claudette Laws[facility].  Follow up with Dr. Erlinda Hong, medical management.     Jeanette Estrada, P.A.   ______________________________ Erick Colace, M.D.    DA/MEDQ  D:  08/20/2011  T:  08/20/2011  Job:  [id number]  cc:   Thana Farr, MD Dr. Harvest Dark

## 2011-08-20 NOTE — Progress Notes (Addendum)
Physical Therapy Discharge Summary  Patient Details  Name: Jeanette Estrada MRN: [id number] Date of Birth: 01/29/1976  Today's Date: 08/20/2011 Time: 1000-1050 Time Calculation (min): 50 min  Skilled intervention: sister arrived after session but verbalized understanding of need for 24 hr S, pt increased fall risk, energy conservation techniques, safety in the home/remove throw rugs, guarding with car transfer and stair technique. Pt educated on energy conservation, not overheating, not performing reciprocal steps on stairs to preserve knee to significant L hyperthrust, HHPT,fall risk. Pt reports she has performed floor transfers in other therapies.  Car transfer and bed to w/c mod I stand pivot with RW Gait household and community (over grass, uneven surfaces outside 150 ft) S with decreased cadence, wide BOS, increased toe out, ataxia worse with LLE swing phase Stair training with 1 rail reciprocal and step to pattern 15 steps, pt agrees to perform step to pattern d/t knee hyperextension and for decreased fall risk, S without LOB.  Patient has met 8 of 8 long term goals due to improved balance, improved postural control, ability to compensate for deficits and improved coordination.  Patient to discharge at an ambulatory level Supervision.   Patient's care partner is independent to provide the necessary physical and cognitive assistance at discharge. Pt uses rollator walker.  Reasons goals not met: n/a  Recommendation:  Patient will benefit from ongoing skilled PT services in home health setting to continue to advance safe functional mobility, address ongoing impairments in strength, motor control, sensation, cognition, balance and postural control  and minimize fall risk.  Equipment: rollator  Reasons for discharge: discharge from hospital  Patient/family agrees with progress made and goals achieved: Yes  PT Discharge Precautions/Restrictions Precautions Precautions:  Fall Restrictions Weight Bearing Restrictions: No Vital Signs Therapy Vitals Pulse Rate: 88  (with activity) Pain Pain Assessment Pain Assessment: No/denies pain Pain Score: 0-No pain Vision/Perception  Vision - History Baseline Vision: Wears glasses all the time  Cognition Overall Cognitive Status: Impaired Arousal/Alertness: Awake/alert Orientation Level: Oriented X4 Attention: Alternating Selective Attention: Appears intact Alternating Attention: Impaired Alternating Attention Impairment: Functional complex (Mod I) Memory: Impaired Memory Impairment: Decreased short term memory (Mod I) Decreased Short Term Memory: Functional complex (Mod I) Awareness: Impaired (Decreased awareness of verbosity ) Problem Solving: Impaired Problem Solving Impairment: Functional complex (Mod I) Executive Function: Self Monitoring;Self Correcting Self Monitoring: Impaired Self Monitoring Impairment: Functional complex (Mod I-S) Self Correcting: Impaired Self Correcting Impairment: Functional complex (Mod I-S) Safety/Judgment: Appears intact Sensation Sensation Light Touch: Impaired by gross assessment (some numbness LE's) Proprioception: Appears Intact Coordination Gross Motor Movements are Fluid and Coordinated: No Fine Motor Movements are Fluid and Coordinated: No Motor  Motor Motor: Abnormal postural alignment and control;Ataxia  Mobility   Locomotion  Ambulation Ambulation/Gait Assistance: 5: Supervision Gait Gait velocity: 1.86 ft/sec with rollator/neighborhood ambulator  Trunk/Postural Assessment  Cervical Assessment Cervical Assessment: Within Functional Limits Thoracic Assessment Thoracic Assessment: Within Functional Limits Lumbar Assessment Lumbar Assessment: Within Functional Limits Postural Control Postural Control: Deficits on evaluation Postural Limitations: impaired balance strategies  Balance Static Sitting Balance Static Sitting - Level of Assistance: 7:  Independent Dynamic Sitting Balance Dynamic Sitting - Level of Assistance: 7: Independent Static Standing Balance Static Standing - Level of Assistance: 6: Modified independent (Device/Increase time) Static Standing - Comment/# of Minutes: with BUE support, LE shaking still noted Dynamic Standing Balance Dynamic Standing - Level of Assistance: 5: Stand by assistance (1 UE support) Dynamic Standing - Balance Activities: Forward lean/weight shifting;Lateral lean/weight shifting;Reaching across midline Extremity  Assessment      RLE Strength RLE Overall Strength Comments: strength 4/5 but decreased motor control and coordination LLE Strength LLE Overall Strength Comments: same  See FIM for current functional status  Michaelene Song 08/20/2011, 12:15 PM

## 2011-08-20 NOTE — Progress Notes (Signed)
Recreational Therapy Discharge Summary Patient Details  Name: Jeanette Estrada MRN: [id number] Date of Birth: Jul 17, 1975 Today's Date: 08/20/2011  Long term goals set: 1  Long term goals met: 1  Comments on progress toward goals: Pt met supervision level for TR tasks and is ready for discharge home today with family to provide 24 hour supervision.  Pt continue to require increased time and cuing for safety during tasks.  Reasons for discharge: discharge from hospital Patient/family agrees with progress made and goals achieved: Yes  Keslie Gritz 08/20/2011, 5:24 PM

## 2011-09-09 ENCOUNTER — Inpatient Hospital Stay: Payer: Self-pay | Admitting: Physical Medicine & Rehabilitation

## 2012-04-03 ENCOUNTER — Encounter: Payer: Self-pay | Admitting: Diagnostic Neuroimaging

## 2012-04-07 ENCOUNTER — Other Ambulatory Visit: Payer: Self-pay | Admitting: Diagnostic Neuroimaging

## 2012-04-07 DIAGNOSIS — G35 Multiple sclerosis: Secondary | ICD-10-CM

## 2012-04-15 ENCOUNTER — Other Ambulatory Visit: Payer: Medicaid Other

## 2012-06-24 ENCOUNTER — Other Ambulatory Visit: Payer: Medicaid Other

## 2012-06-25 ENCOUNTER — Other Ambulatory Visit: Payer: Medicaid Other

## 2012-07-01 ENCOUNTER — Ambulatory Visit
Admission: RE | Admit: 2012-07-01 | Discharge: 2012-07-01 | Disposition: A | Discharge status: Home / Self Care | Payer: Medicaid Other | Source: Ambulatory Visit | Attending: Diagnostic Neuroimaging | Admitting: Diagnostic Neuroimaging

## 2012-07-01 ENCOUNTER — Ambulatory Visit: Payer: Self-pay | Admitting: Diagnostic Neuroimaging

## 2012-07-01 DIAGNOSIS — G35 Multiple sclerosis: Secondary | ICD-10-CM

## 2012-07-01 MED ORDER — GADOBENATE DIMEGLUMINE 529 MG/ML IV SOLN
13.0000 mL | Freq: Once | INTRAVENOUS | Status: AC | PRN
  Administered 2012-07-01: 13 mL via INTRAVENOUS

## 2012-07-06 ENCOUNTER — Encounter: Payer: Self-pay | Admitting: Neurology

## 2012-07-30 ENCOUNTER — Ambulatory Visit: Payer: Medicaid Other | Admitting: Diagnostic Neuroimaging

## 2012-08-14 ENCOUNTER — Telehealth: Payer: Self-pay | Admitting: *Deleted

## 2012-08-14 NOTE — Telephone Encounter (Signed)
Patient calling to reschedule a missed appointment.  She would like to see if she could be seen sooner than the next available.

## 2012-08-27 NOTE — Telephone Encounter (Signed)
Printed and given to scheduling dept.

## 2012-08-28 ENCOUNTER — Telehealth: Payer: Self-pay | Admitting: Diagnostic Neuroimaging

## 2012-09-14 ENCOUNTER — Telehealth: Payer: Self-pay | Admitting: Diagnostic Neuroimaging

## 2012-09-14 NOTE — Telephone Encounter (Signed)
Patient calling wanting her MRI results from a couple weeks ago.

## 2012-10-06 NOTE — Telephone Encounter (Signed)
Spoke to patient. Explained Dr. Richrd Humbles previous note. Patient says because of transportation and spouse availability, Wednesday's work best. Scheduled appt. -cl

## 2012-10-06 NOTE — Telephone Encounter (Signed)
pls let patient know MRI scan is getting worse. She needs to come in next 1-2 weeks and discuss new treatment options. -VRP

## 2012-10-14 ENCOUNTER — Ambulatory Visit (INDEPENDENT_AMBULATORY_CARE_PROVIDER_SITE_OTHER): Payer: Medicaid Other | Admitting: Diagnostic Neuroimaging

## 2012-10-14 ENCOUNTER — Encounter: Payer: Self-pay | Admitting: Diagnostic Neuroimaging

## 2012-10-14 VITALS — BP 109/76 | HR 86

## 2012-10-14 DIAGNOSIS — G35 Multiple sclerosis: Secondary | ICD-10-CM

## 2012-10-14 MED ORDER — DIMETHYL FUMARATE 240 MG PO CPDR: 240.0000 mg | DELAYED_RELEASE_CAPSULE | Freq: Two times a day (BID) | ORAL | Status: DC

## 2012-10-14 NOTE — Progress Notes (Signed)
[facility]  PATIENT: Jeanette Estrada DOB: 1975/09/03  REFERRING CLINICIAN:  HISTORY FROM: patient REASON FOR VISIT: follow up   HISTORICAL  CHIEF COMPLAINT:  Chief Complaint  Patient presents with  . Follow-up    HISTORY OF PRESENT ILLNESS:   UPDATE 10/14/12: Since last visit, had MRI brain which showed progression of MS lesions. Symptoms slightly progressed (more swallow difficulty and double vision). Tolerating betaseron with some flu-like sxs.   UPDATE 04/01/12: Since last visit, has started on betaseron. Tolerating without HA. Some burning at injection sites. No new MS symptoms. Using rolling walker. Asking about ampyra.  UPDATE 12/31/11:  Has had 2 falls since last visit without injury.  has occasional numbness and difficulty with swallowing uses rolling walker. Vision is about the same.  Continues to have urine incontinence.  She was taking Oxybutinin in the hospital but has not taken any since June 2013.  She has not been evaluated by Urology.  She is getting an Mining engineer wheelchair later this month.  She uses her rolling walker at all times.  She did not have a JCV antibody lab done.     PRIOR HPI (10/31/11): 37 year old left-handed female here for evaluation of multiple sclerosis.  In 1999 patient had first episode of "eyes crossed" which was noted on a routine ophthalmology evaluation. She was then referred for prism glasses. However when she was using these glasses, her vision seemed to be worse and therefore she stopped using the glasses. Around the same time she was beginning to develop balance and gait difficulty, increasing car sickness. She had a MRI scan at that time which showed "lesions". For some reason this was not followed up. Over the years, she continued to have progression of balance and gait difficulty, slurred speech, falling down, localized weakness of her face, arm or leg. These episodes would tend to last one to 2 weeks a time and occur up  to 5 times per year. She continued to have emergency room visits, CT scans without a specific diagnosis.  In 2010, patient had significant increase in her falls and gait difficulty. From this point forward she needed to hold onto people, walls, furniture or railing for balance.  In 2012 patient was at home, had an episode of decreased responsiveness, whole body tremors and shaking, possible seizure; but no mention in hospital records.  In May 2013, patient had continued progression of symptoms, whole body tremors and shaking, and presented to the emergency room. She was evaluated with MRI of the brain and cervical spine. Extensive T2 hyperintense lesions were noted throughout the brain and spinal cord and presumptive diagnosis of multiple sclerosis was made. She was treated with IV Solu-Medrol for 5 days, and then transferred to inpatient rehabilitation for recovery. Since that time patient's symptoms have been fairly stable. She continues to struggle with significant urinary incontinence, gait difficulty, whole-body tremors when she stands.  REVIEW OF SYSTEMS: Full 14 system review of systems performed and notable only for ringing in ears trouble swallowing urination problem double vision memory loss headache numbness weakness slurred speech dizziness tremor anxiety.  ALLERGIES: No Known Allergies  HOME MEDICATIONS: Outpatient Prescriptions Prior to Visit  Medication Sig Dispense Refill  . oxybutynin (DITROPAN) 5 MG tablet Take 0.5 tablets (2.5 mg total) by mouth 2 (two) times daily.  60 tablet  1   No facility-administered medications prior to visit.    PAST MEDICAL HISTORY: Past Medical History  Diagnosis Date  . Neuromuscular disease   . Multiple  sclerosis     PAST SURGICAL HISTORY: Past Surgical History  Procedure Laterality Date  . Tubal ligation      FAMILY HISTORY: Family History  Problem Relation Age of Onset  . Multiple sclerosis Maternal Grandmother   . Tremor       SOCIAL HISTORY:  History   Social History  . Marital Status: Married    Spouse Name: N/A    Number of Children: 3  . Years of Education: College   Occupational History  . Not on file.   Social History Main Topics  . Smoking status: Former Smoker -- 3.00 packs/day for 2 years    Types: Cigarettes    Quit date: 04/01/2012  . Smokeless tobacco: Never Used  . Alcohol Use: Yes     Comment: occassionally drinks a glass of wine  . Drug Use: No  . Sexually Active: Yes   Other Topics Concern  . Not on file   Social History Narrative   Pt lives at home with her family.   Caffeine Use: occasionally     PHYSICAL EXAM  Filed Vitals:   10/14/12 1450  BP: 109/76  Pulse: 86    Not recorded    Cannot calculate BMI with a height equal to zero.  GENERAL EXAM: General: Patient is awake, alert and in no acute distress.  Well developed and groomed. Cardiovascular: No carotid artery bruits.  Heart is regular rate and rhythm with no murmurs.  Neurologic Exam  Mental Status: Awake, alert. Language is fluent and comprehension intact. Cranial Nerves: Pupils are equal and reactive to light.   DECR LIGHT SENS IN LEFT EYE.  Visual fields are full to confrontation.  Conjugate eye movements are full and symmetric.  NYSTAGMUS ON RIGHT, LEFT, UP AND DOWN GAZE. SACCADIC BREAKDOWN OF SMOOTH PURSUIT. OVERSHOOT ON SACCADES. DECR SENS IN LEFT FACE. Facial strength are symmetric.  Hearing is intact.  Palate elevated symmetrically and uvula is midline.  Shoulder shrug is symmetric.  Tongue is midline. Motor: Normal bulk and tone.  BUE 4/5. BLE (HF 4/5). INCREASED TONE IN BLE.  Sensory: DECREASED IN LUE AND LLE.  Coordination: SLOW FTN WITH ATAXIA.  Gait and Station: SIGNIFICANT CLONUS AND WHOLE BODY TREMORS WHEN SHE STANDS. NOT ABLE TO WALK WITHOUT ASSISTANCE. WIDE BASED AND ATAXIC. Reflexes: Deep tendon reflexes in the upper and lower extremity are BRISK and symmetric. CROSSED ADDUCTORS.     DIAGNOSTIC DATA (LABS, IMAGING, TESTING) - I reviewed patient records, labs, notes, testing and imaging myself where available.  Lab Results  Component Value Date   WBC 13.0* 08/15/2011   HGB 11.4* 08/15/2011   HCT 33.9* 08/15/2011   MCV 89.0 08/15/2011   PLT 193 08/15/2011      Component Value Date/Time   NA 140 08/15/2011 0635   K 3.9 08/15/2011 0635   CL 103 08/15/2011 0635   CO2 25 08/15/2011 0635   GLUCOSE 116* 08/15/2011 0635   BUN 13 08/15/2011 0635   CREATININE 0.63 08/15/2011 0635   CALCIUM 9.2 08/15/2011 0635   PROT 6.6 08/15/2011 0635   ALBUMIN 3.7 08/15/2011 0635   AST 30 08/15/2011 0635   ALT 62* 08/15/2011 0635   ALKPHOS 64 08/15/2011 0635   BILITOT 0.2* 08/15/2011 0635   GFRNONAA >90 08/15/2011 0635   GFRAA >90 08/15/2011 0635   No results found for this basename: CHOL, HDL, LDLCALC, LDLDIRECT, TRIG, CHOLHDL   No results found for this basename: HGBA1C   No results found for this basename: ZOXWRUEA54  No results found for this basename: TSH    08/12/11 MRI brain - extensive supratentorial and infratentorial chronic demyelinating plaques; single right inferior frontal enhancing lesion; numerous T1 black holes  08/12/11 MRI cervical spine - multiple spinal cord chronic demyelinating plaques  11/20/11 ANA, ACA, ACE, CBC with diff, CMP, HIV, HTLV (I, II), Hepatitis panel, NMO were all normal  07/06/12 MRI brain - extensive abnormal white matter changes consistent with advanced multiple sclerosis. No enhancing lesions are noted. Presence of T1 black holes and atrophy of corpus callosum indicates chronic disease. Overall worsening of white matter disease compared with MRI scan dated 08/12/2011. At least one new lesion in left corona radiata.   ASSESSMENT AND PLAN  37 y.o. female with multiple sclerosis. History, exam, MRI findings are all consistent with diagnosis of multiple sclerosis.  On betaseron since Nov 2013. Progression of disease on MRI in spite of  betaseron.   PLAN: 1. STOP betaseron 2. check CBC 3. START Tecfidera 4. PT evaluation   Orders Placed This Encounter  Procedures  . CBC With differential/Platelet  . Ambulatory referral to Physical Therapy       Suanne Marker, MD 10/14/2012, 4:13 PM Certified in Neurology, Neurophysiology and Neuroimaging  [facility] [address] [place], [place] [address] [phone]

## 2012-10-14 NOTE — Patient Instructions (Signed)
Wait 2 weeks after last dose of betaseron.  Then start tecfidera 120mg  twice a day.

## 2012-10-15 ENCOUNTER — Telehealth: Payer: Self-pay | Admitting: Diagnostic Neuroimaging

## 2012-10-15 LAB — CBC WITH DIFFERENTIAL
Basos: 0 % (ref 0–3)
Eosinophils Absolute: 0.1 10*3/uL (ref 0.0–0.4)
Hemoglobin: 12.6 g/dL (ref 11.1–15.9)
MCH: 28.1 pg (ref 26.6–33.0)
MCHC: 32.2 g/dL (ref 31.5–35.7)
Monocytes Absolute: 0.4 10*3/uL (ref 0.1–0.9)
Neutrophils Absolute: 6.8 10*3/uL (ref 1.4–7.0)
Neutrophils Relative %: 75 % — ABNORMAL HIGH (ref 40–74)

## 2012-10-20 NOTE — Telephone Encounter (Signed)
I do not have questionaire.  Jeanette Estrada in MR to check on this.

## 2012-10-21 ENCOUNTER — Other Ambulatory Visit: Payer: Self-pay

## 2012-10-21 MED ORDER — DIMETHYL FUMARATE 240 MG PO CPDR: 240.0000 mg | DELAYED_RELEASE_CAPSULE | Freq: Two times a day (BID) | ORAL | Status: DC

## 2012-10-21 NOTE — Telephone Encounter (Signed)
Pharmacy called requesting Rx for Tecfidera.

## 2012-10-23 ENCOUNTER — Telehealth: Payer: Self-pay

## 2012-10-23 NOTE — Telephone Encounter (Signed)
I called and left a VM for Jeanette Estrada regarding Jeanette Estrada. I will have medical records faxed to her today, once I have confirmed that payment has been made. We no longer complete physician statements.

## 2012-10-23 NOTE — Telephone Encounter (Signed)
I called this patient and let her know that I am copying all forms and will fax to her disability attorney by the close of business today. I also let her know that she does not have to pay for Physician Statement because we simply copy and paste from the office notes and so we no longer complete Physician Statements. So, her charge will only be for copies, not the Physician Statement. Patient said she would call medical records to take care of that.  It is 3:27 p.m. And I have faxed all records from initial referral to our practice through October 15, 2012 that are documents generated from or to our office.

## 2012-10-28 ENCOUNTER — Telehealth: Payer: Self-pay | Admitting: Diagnostic Neuroimaging

## 2012-10-28 ENCOUNTER — Telehealth: Payer: Self-pay

## 2012-10-28 NOTE — Telephone Encounter (Signed)
[facility] [employer] [facility] sent [place] a fax verifying [facility] has shipped the patient's Tecfidera as of 10/22/2012.  Nothing further is needed at this time.

## 2012-10-29 ENCOUNTER — Encounter: Payer: Self-pay | Admitting: Diagnostic Neuroimaging

## 2012-10-29 NOTE — Telephone Encounter (Signed)
Pt called and stated that the 10-14-12 office note states that she smoked 3packs/day for 2 years and this is not correct.  She smoked maybe 3 cigarettes / day for 2 yrs.  Would like to have changed on her medical records.

## 2012-11-09 ENCOUNTER — Telehealth: Payer: Self-pay | Admitting: Diagnostic Neuroimaging

## 2012-11-09 NOTE — Telephone Encounter (Signed)
Called patient she stated she was switched from betaseron to tecfidera.Marland KitchenMarland KitchenMarland KitchenAnd has been having bad diarrhea and vomiting for 3 days,  state she was taking off because it was not helping her. Patient has been taking two per day she states  If she has to decrease she will. Best contact [phone] please advise.

## 2012-12-20 IMAGING — CT CT HEAD W/O CM
1 of 2 series · 13 of 30 positions shown, 17 images · non-contrast
Comparison: Head CT 03/22/2005.

CLINICAL DATA: Weakness.  Frequent falls.

CT HEAD WITHOUT CONTRAST
TECHNIQUE: Contiguous axial images were obtained from the base of
the skull through the vertex without contrast.

[Series 2: brain · axial · 0.47mm/px · z∈[+11,+132]mm · 13 of 28 slices shown, 17 images]
[im 2/28  brain]
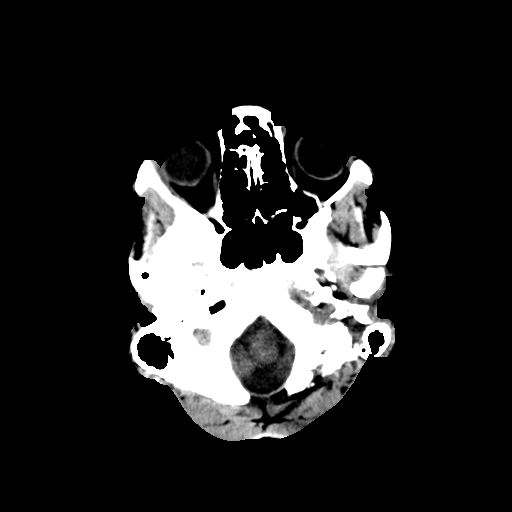
[im 2/28  bone]
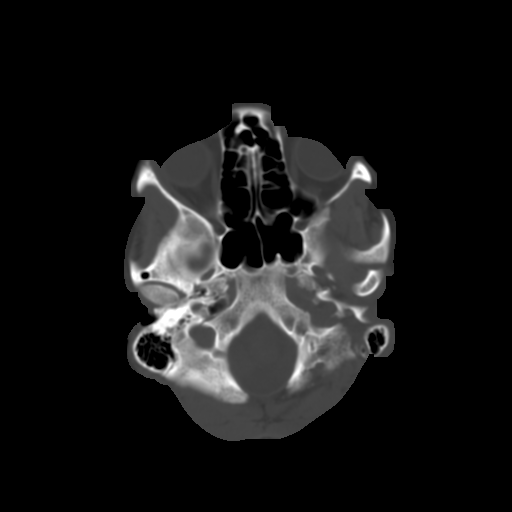
[im 4/28  brain]
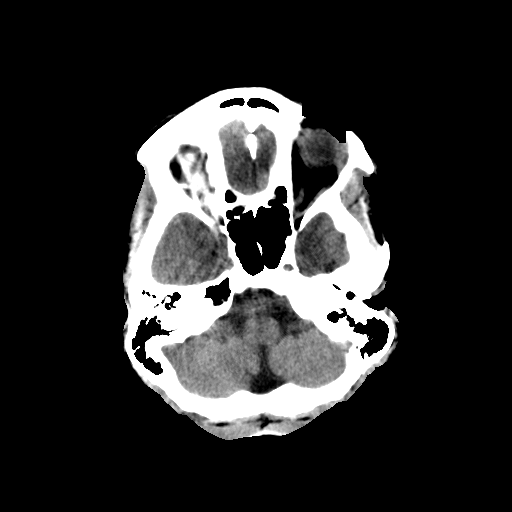
[im 6/28  brain]
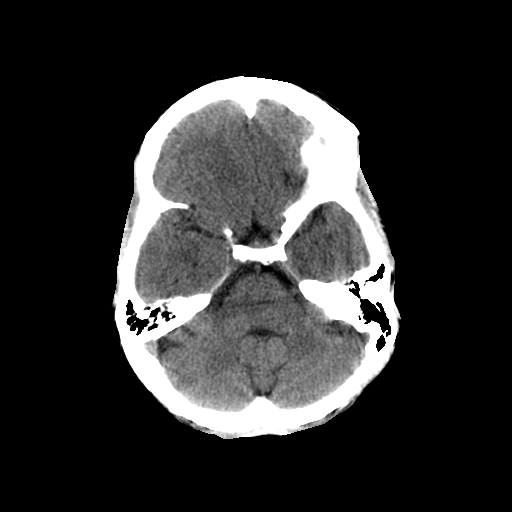
[im 8/28  brain]
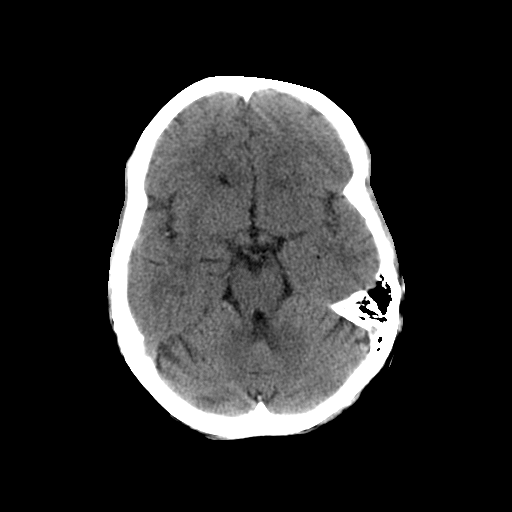
[im 10/28  brain]
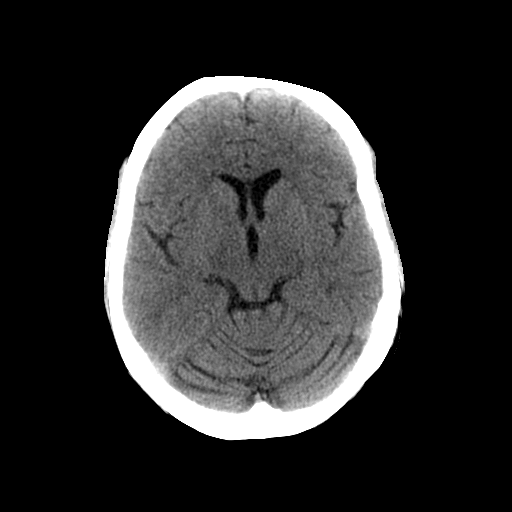
[im 10/28  bone]
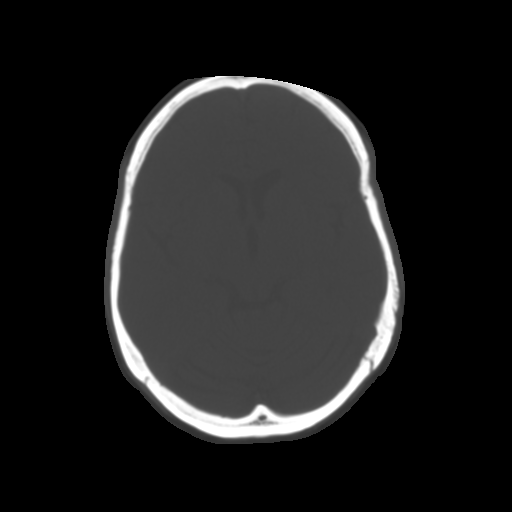
[im 12/28  brain]
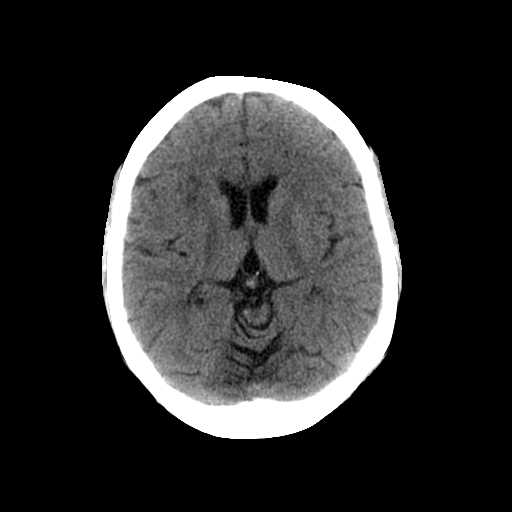
[im 14/28  brain]
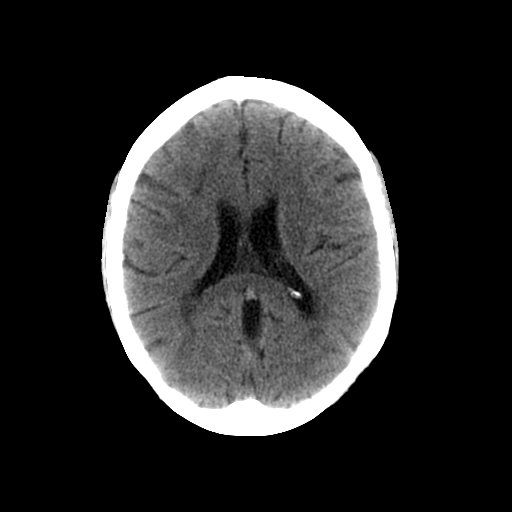
[im 16/28  brain]
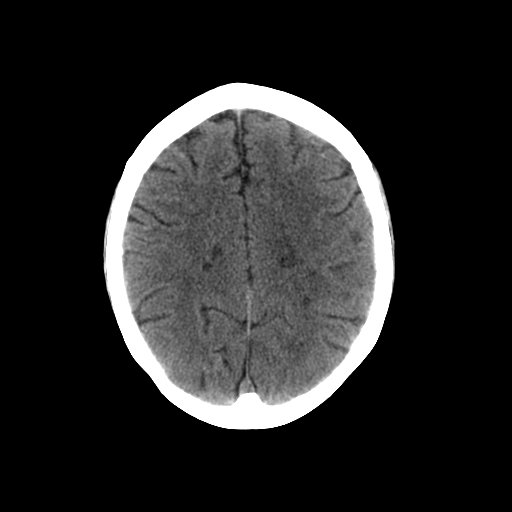
[im 18/28  brain]
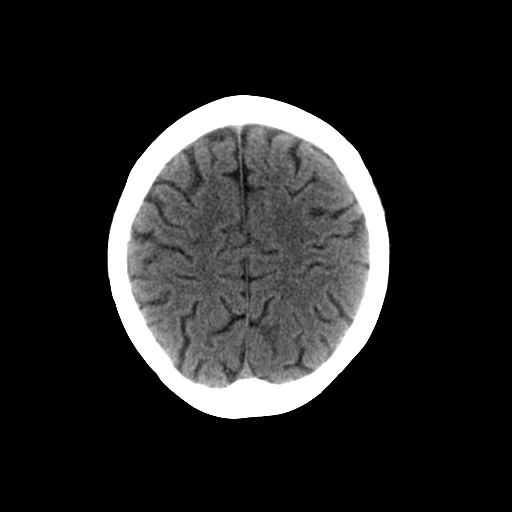
[im 18/28  bone]
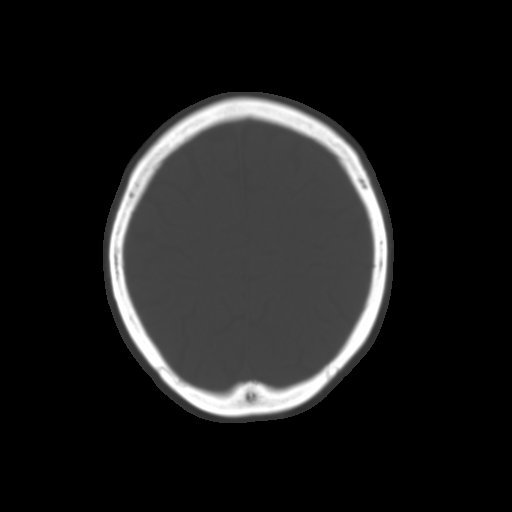
[im 20/28  brain]
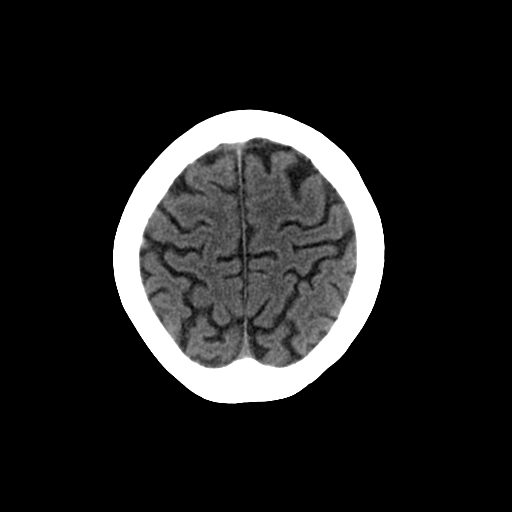
[im 22/28  brain]
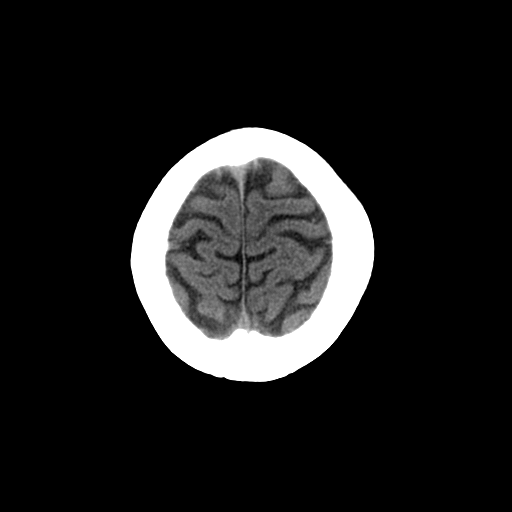
[im 24/28  brain]
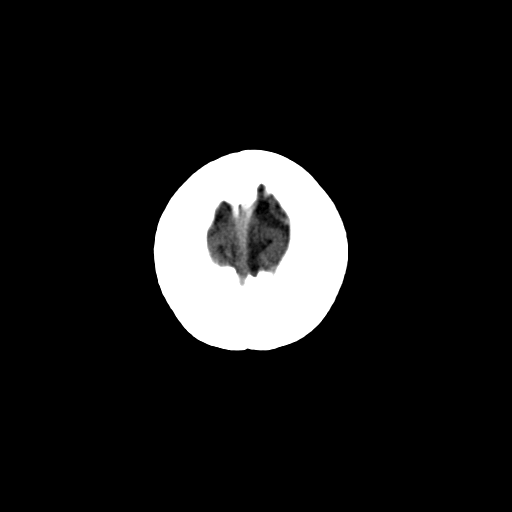
[im 26/28  brain]
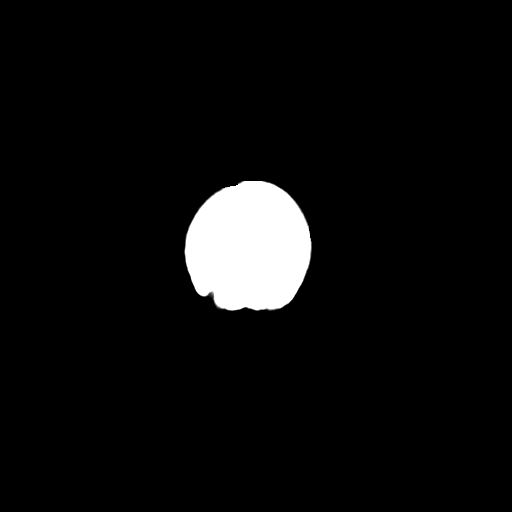
[im 26/28  bone]
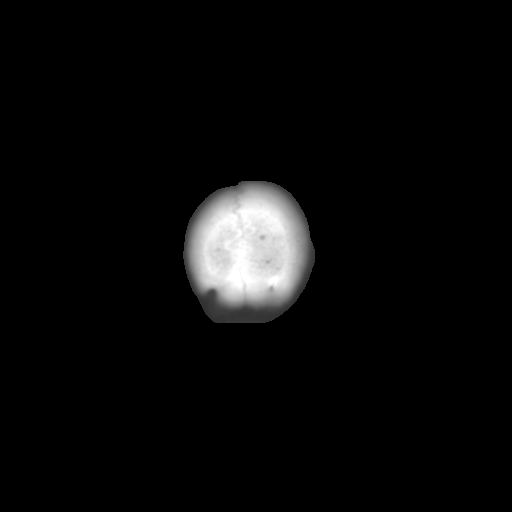

[13 of 30 positions shown; findings below may reference images not displayed]

FINDINGS: The ventricles are normal and stable.  No extra-axial
fluid collections are identified.  There are some small scattered
periventricular areas of low attenuation.  This is a nonspecific
finding but in a patient this age and as would certainly be a
consideration.

No extra-axial fluid collections are identified.  No findings for
hemispheric infarction and/or intracranial hemorrhage.  The
brainstem and cerebellum appear normal.

The bony structures are intact.  No significant sinus disease.
IMPRESSION: 1.  Small foci of low attenuation in the periventricular white
matter somewhat suspicious for multiple sclerosis but could also be
seen with small vessel disease, vasculitis, migraine headaches or
other demyelinating process.
2.  No findings for hemispheric infarction or intracranial
hemorrhage.

## 2013-01-15 ENCOUNTER — Telehealth: Payer: Self-pay | Admitting: *Deleted

## 2013-01-15 DIAGNOSIS — G35 Multiple sclerosis: Secondary | ICD-10-CM

## 2013-01-15 NOTE — Telephone Encounter (Signed)
This is for electric wheelchair or scooter.

## 2013-01-15 NOTE — Telephone Encounter (Signed)
Pt called and is asking about WC or scooter.  She had PT with [facility] , [facility] back in 09-2012.  She stated that appt made for [place] office to do PT eval for wheelchair but to far away and she could not make it.  I told her I would f/u on this and let her know something next week.  She was ok with this.

## 2013-01-15 NOTE — Telephone Encounter (Signed)
I can write rx for regular WC. If electric chair or powerscooter, needs PT or PMR eval. -VRP

## 2013-01-18 ENCOUNTER — Other Ambulatory Visit: Payer: Self-pay | Admitting: *Deleted

## 2013-01-18 DIAGNOSIS — G35 Multiple sclerosis: Secondary | ICD-10-CM

## 2013-01-18 MED ORDER — DIMETHYL FUMARATE 240 MG PO CPDR: 240.0000 mg | DELAYED_RELEASE_CAPSULE | Freq: Two times a day (BID) | ORAL | Status: DC

## 2013-01-18 NOTE — Progress Notes (Signed)
Order placed for electric wheelchair.

## 2013-01-18 NOTE — Telephone Encounter (Signed)
Patient was getting Tecfidera from [facility]. She lost her insurance so she got it from the manufacturer. She now has insurance but [facility] D/C'd the script. Need new one.

## 2013-01-25 ENCOUNTER — Other Ambulatory Visit: Payer: Self-pay | Admitting: Diagnostic Neuroimaging

## 2013-01-25 MED ORDER — DIMETHYL FUMARATE 240 MG PO CPDR: 240.0000 mg | DELAYED_RELEASE_CAPSULE | Freq: Two times a day (BID) | ORAL | Status: DC

## 2013-01-25 NOTE — Telephone Encounter (Signed)
Pt's prescription was faxed to [facility] at [phone].

## 2013-02-18 ENCOUNTER — Ambulatory Visit: Payer: Medicare Other | Attending: Diagnostic Neuroimaging | Admitting: Physical Therapy

## 2013-02-18 DIAGNOSIS — IMO0001 Reserved for inherently not codable concepts without codable children: Secondary | ICD-10-CM | POA: Insufficient documentation

## 2013-02-18 DIAGNOSIS — R269 Unspecified abnormalities of gait and mobility: Secondary | ICD-10-CM | POA: Insufficient documentation

## 2013-02-22 DIAGNOSIS — Z0289 Encounter for other administrative examinations: Secondary | ICD-10-CM

## 2013-02-26 ENCOUNTER — Telehealth: Payer: Self-pay

## 2013-02-26 NOTE — Telephone Encounter (Signed)
I attempted to contact family to review FMLA forms. I need to get some information from them to get these completed. There was no answer and not voice message set up.

## 2013-03-03 ENCOUNTER — Telehealth: Payer: Self-pay | Admitting: Diagnostic Neuroimaging

## 2013-03-03 NOTE — Telephone Encounter (Signed)
Met with husband. Form completed and give to husband.

## 2013-03-03 NOTE — Telephone Encounter (Signed)
Please advise 

## 2013-03-03 NOTE — Telephone Encounter (Signed)
Pt states she was returning Valerie's call but I was unable to find her

## 2013-03-03 NOTE — Telephone Encounter (Signed)
I called patient again. VM available. I left VM that I needed to speak with patient regarding forms. Please call [phone]

## 2013-03-30 ENCOUNTER — Telehealth: Payer: Self-pay | Admitting: *Deleted

## 2013-03-30 NOTE — Telephone Encounter (Signed)
Patient called to state that [facility] will be accompanying her on her appointment with Dr. Marjory Lies on May 12, 2013 at 2:30 pm for a wheelchair evaluation.

## 2013-05-04 NOTE — Telephone Encounter (Signed)
Ok

## 2013-05-12 ENCOUNTER — Encounter: Payer: Self-pay | Admitting: Diagnostic Neuroimaging

## 2013-05-12 ENCOUNTER — Ambulatory Visit (INDEPENDENT_AMBULATORY_CARE_PROVIDER_SITE_OTHER): Payer: Medicare Other | Admitting: Diagnostic Neuroimaging

## 2013-05-12 VITALS — BP 95/62 | HR 67

## 2013-05-12 DIAGNOSIS — R6889 Other general symptoms and signs: Secondary | ICD-10-CM | POA: Diagnosis not present

## 2013-05-12 DIAGNOSIS — R269 Unspecified abnormalities of gait and mobility: Secondary | ICD-10-CM | POA: Diagnosis not present

## 2013-05-12 DIAGNOSIS — G35 Multiple sclerosis: Secondary | ICD-10-CM | POA: Diagnosis not present

## 2013-05-12 DIAGNOSIS — G35D Multiple sclerosis, unspecified: Secondary | ICD-10-CM | POA: Diagnosis not present

## 2013-05-12 NOTE — Progress Notes (Signed)
[facility]  PATIENT: Jeanette Estrada DOB: [birth year]  REFERRING CLINICIAN:  HISTORY FROM: patient REASON FOR VISIT: follow up   HISTORICAL  CHIEF COMPLAINT:  Chief Complaint  Patient presents with  . Follow-up    MS    HISTORY OF PRESENT ILLNESS:   UPDATE 05/12/13: Patient presents for face-to-face evaluation of electric power wheelchair. Patient is accompanied by husband as well as representative from [facility]. Otherwise she's doing well. She's tolerating tecfidera medication. No new neurologic symptoms.  UPDATE 10/14/12: Since last visit, had MRI brain which showed progression of MS lesions. Symptoms slightly progressed (more swallow difficulty and double vision). Tolerating betaseron with some flu-like sxs.   UPDATE 04/01/12: Since last visit, has started on betaseron. Tolerating without HA. Some burning at injection sites. No new MS symptoms. Using rolling walker. Asking about ampyra.  UPDATE 12/31/11:  Has had 2 falls since last visit without injury.  has occasional numbness and difficulty with swallowing uses rolling walker. Vision is about the same.  Continues to have urine incontinence.  She was taking Oxybutinin in the hospital but has not taken any since June 2013.  She has not been evaluated by Urology.  She is getting an Mining engineer wheelchair later this month.  She uses her rolling walker at all times.  She did not have a JCV antibody lab done.    PRIOR HPI (10/31/11): 38 year old left-handed female here for evaluation of multiple sclerosis.  In 1999 patient had first episode of "eyes crossed" which was noted on a routine ophthalmology evaluation. She was then referred for prism glasses. However when she was using these glasses, her vision seemed to be worse and therefore she stopped using the glasses. Around the same time she was beginning to develop balance and gait difficulty, increasing car sickness. She had a MRI scan at that time which showed  "lesions". For some reason this was not followed up. Over the years, she continued to have progression of balance and gait difficulty, slurred speech, falling down, localized weakness of her face, arm or leg. These episodes would tend to last one to 2 weeks a time and occur up to 5 times per year. She continued to have emergency room visits, CT scans without a specific diagnosis.  In 2010, patient had significant increase in her falls and gait difficulty. From this point forward she needed to hold onto people, walls, furniture or railing for balance.  In 2012 patient was at home, had an episode of decreased responsiveness, whole body tremors and shaking, possible seizure; but no mention in hospital records.  In May 2013, patient had continued progression of symptoms, whole body tremors and shaking, and presented to the emergency room. She was evaluated with MRI of the brain and cervical spine. Extensive T2 hyperintense lesions were noted throughout the brain and spinal cord and presumptive diagnosis of multiple sclerosis was made. She was treated with IV Solu-Medrol for 5 days, and then transferred to inpatient rehabilitation for recovery. Since that time patient's symptoms have been fairly stable. She continues to struggle with significant urinary incontinence, gait difficulty, whole-body tremors when she stands.  REVIEW OF SYSTEMS: Full 14 system review of systems performed and notable only for gait instability, urinary urgency, tremor.   ALLERGIES: No Known Allergies  HOME MEDICATIONS: Outpatient Prescriptions Prior to Visit  Medication Sig Dispense Refill  . baclofen (LIORESAL) 10 MG tablet Take 10 mg by mouth 3 (three) times daily.      . Dimethyl Fumarate (TECFIDERA)  240 MG CPDR Take 1 capsule (240 mg total) by mouth 2 (two) times daily.  180 capsule  1  . ibuprofen (ADVIL,MOTRIN) 200 MG tablet Take 200 mg by mouth every 6 (six) hours as needed for pain.      [name] oxybutynin (DITROPAN) 5 MG  tablet Take 0.5 tablets (2.5 mg total) by mouth 2 (two) times daily.  60 tablet  1   No facility-administered medications prior to visit.    PAST MEDICAL HISTORY: Past Medical History  Diagnosis Date  . Neuromuscular disease   . Multiple sclerosis     PAST SURGICAL HISTORY: Past Surgical History  Procedure Laterality Date  . Tubal ligation      FAMILY HISTORY: Family History  Problem Relation Age of Onset  . Multiple sclerosis Maternal Grandmother   . Tremor      SOCIAL HISTORY:  History   Social History  . Marital Status: Married    Spouse Name: Siri Cole    Number of Children: 3  . Years of Education: College   Occupational History  . Not on file.   Social History Main Topics  . Smoking status: Former Smoker -- 2 years    Types: Cigarettes    Quit date: 04/01/2012  . Smokeless tobacco: Never Used     Comment: 3 cigarettes/day  . Alcohol Use: Yes     Comment: occassionally drinks a glass of wine  . Drug Use: No  . Sexual Activity: Yes   Other Topics Concern  . Not on file   Social History Narrative   Pt lives at home with her family.   Caffeine Use: occasionally     PHYSICAL EXAM  Filed Vitals:   05/12/13 1444  BP: 95/62  Pulse: 67    Not recorded    Cannot calculate BMI with a height equal to zero.  GENERAL EXAM: General: Patient is awake, alert and in no acute distress.  Well developed and groomed. Cardiovascular: No carotid artery bruits.  Heart is regular rate and rhythm with no murmurs.  Neurologic Exam  Mental Status: Awake, alert. Language is fluent and comprehension intact. Cranial Nerves: Pupils are equal and reactive to light.   DECR LIGHT SENS IN LEFT EYE.  Visual fields are full to confrontation.  Conjugate eye movements are full and symmetric.  NYSTAGMUS ON RIGHT, LEFT, UP AND DOWN GAZE. SACCADIC BREAKDOWN OF SMOOTH PURSUIT. OVERSHOOT ON SACCADES. DECR SENS IN LEFT FACE. Facial strength are symmetric.  Hearing is intact.  Palate  elevated symmetrically and uvula is midline.  Shoulder shrug is symmetric.  Tongue is midline. Motor: Normal bulk and tone.  BUE 4/5. BLE (HF 4/5). INCREASED TONE IN BLE.  Sensory: DECREASED IN LUE AND LLE.  Coordination: SLOW FTN WITH ATAXIA.  Gait and Station: SITTING IN [employer]. NOT ABLE TO WALK WITHOUT ASSISTANCE. WIDE BASED AND ATAXIC GAIT. Reflexes: Deep tendon reflexes in the upper and lower extremity are BRISK and symmetric. CROSSED ADDUCTORS.    DIAGNOSTIC DATA (LABS, IMAGING, TESTING) - I reviewed patient records, labs, notes, testing and imaging myself where available.  Lab Results  Component Value Date   WBC 9.1 10/14/2012   HGB 12.6 10/14/2012   HCT 39.1 10/14/2012   MCV 87 10/14/2012   PLT 255 10/14/2012      Component Value Date/Time   NA 140 08/15/2011 0635   K 3.9 08/15/2011 0635   CL 103 08/15/2011 0635   CO2 25 08/15/2011 0635   GLUCOSE 116* 08/15/2011 1655  BUN 13 08/15/2011 0635   CREATININE 0.63 08/15/2011 0635   CALCIUM 9.2 08/15/2011 0635   PROT 6.6 08/15/2011 0635   ALBUMIN 3.7 08/15/2011 0635   AST 30 08/15/2011 0635   ALT 62* 08/15/2011 0635   ALKPHOS 64 08/15/2011 0635   BILITOT 0.2* 08/15/2011 0635   GFRNONAA >90 08/15/2011 0635   GFRAA >90 08/15/2011 0635   No results found for this basename: CHOL,  HDL,  LDLCALC,  LDLDIRECT,  TRIG,  CHOLHDL   No results found for this basename: HGBA1C   No results found for this basename: VITAMINB12   No results found for this basename: TSH    08/12/11 MRI brain - extensive supratentorial and infratentorial chronic demyelinating plaques; single right inferior frontal enhancing lesion; numerous T1 black holes  08/12/11 MRI cervical spine - multiple spinal cord chronic demyelinating plaques  11/20/11 ANA, ACA, ACE, CBC with diff, CMP, HIV, HTLV (I, II), Hepatitis panel, NMO were all normal  07/06/12 MRI brain - extensive abnormal white matter changes consistent with advanced multiple sclerosis. No enhancing  lesions are noted. Presence of T1 black holes and atrophy of corpus callosum indicates chronic disease. Overall worsening of white matter disease compared with MRI scan dated 08/12/2011. At least one new lesion in left corona radiata.   ASSESSMENT AND PLAN  38 y.o. female with multiple sclerosis. History, exam, MRI findings are all consistent with diagnosis of multiple sclerosis.  On betaseron since Nov 2013, now on tecfidera since August 2014.    PLAN: 1. check CBC and Vit D level 2. Continue tecfidera 3. Paperwork for electric power wheelchair signed - Based on physical therapy evaluation I agree with recommendation for electric power wheelchair. Patient needs a group 3 base for future modification options, as multiple sclerosis may progress over time.    Orders Placed This Encounter  Procedures  . CBC With differential/Platelet  . Vit D  25 hydroxy (rtn osteoporosis monitoring)    Return in about 4 months (around 09/09/2013).    Suanne MarkerVIKRAM R. Lathyn Griggs, MD 05/12/2013, 3:20 PM Certified in Neurology, Neurophysiology and Neuroimaging  [facility] [address], [address] [place], [place] [id number] [phone]

## 2013-05-12 NOTE — Patient Instructions (Addendum)
Continue tecfidera.  Take vitamin D and multi-vitamin daily.

## 2013-05-13 LAB — CBC WITH DIFFERENTIAL
BASOS: 1 %
Basophils Absolute: 0 10*3/uL (ref 0.0–0.2)
EOS: 1 %
Eosinophils Absolute: 0.1 10*3/uL (ref 0.0–0.4)
HCT: 36.5 % (ref 34.0–46.6)
HEMOGLOBIN: 12.3 g/dL (ref 11.1–15.9)
IMMATURE GRANS (ABS): 0 10*3/uL (ref 0.0–0.1)
IMMATURE GRANULOCYTES: 0 %
LYMPHS: 23 %
Lymphocytes Absolute: 1.5 10*3/uL (ref 0.7–3.1)
MCH: 29 pg (ref 26.6–33.0)
MCHC: 33.7 g/dL (ref 31.5–35.7)
MCV: 86 fL (ref 79–97)
MONOCYTES: 6 %
MONOS ABS: 0.4 10*3/uL (ref 0.1–0.9)
NEUTROS PCT: 69 %
Neutrophils Absolute: 4.6 10*3/uL (ref 1.4–7.0)
PLATELETS: 246 10*3/uL (ref 150–379)
RBC: 4.24 x10E6/uL (ref 3.77–5.28)
RDW: 13.5 % (ref 12.3–15.4)
WBC: 6.5 10*3/uL (ref 3.4–10.8)

## 2013-05-13 LAB — VITAMIN D 25 HYDROXY (VIT D DEFICIENCY, FRACTURES): VIT D 25 HYDROXY: 40.9 ng/mL (ref 30.0–100.0)

## 2013-05-26 ENCOUNTER — Telehealth: Payer: Self-pay

## 2013-05-26 MED ORDER — DIMETHYL FUMARATE 240 MG PO CPDR: 240.0000 mg | DELAYED_RELEASE_CAPSULE | Freq: Two times a day (BID) | ORAL | Status: DC

## 2013-05-26 NOTE — Telephone Encounter (Signed)
[facility] (? On spelling) called the office with questions regarding Tecfidera.  I called back.  Spoke with Robynn PaneElise.  She said it just looks like they want to get a refill.  She transferred me to [place].  I gave her verbal auth to refill med.  The patient has just changed pharmacies from [facility] to [facility].

## 2013-05-27 ENCOUNTER — Telehealth: Payer: Self-pay | Admitting: *Deleted

## 2013-05-27 NOTE — Telephone Encounter (Signed)
TC to Jeanette Estrada with [facility]/ DME WC to call back re: letter received about electric WC denial.

## 2013-05-28 NOTE — Telephone Encounter (Signed)
I faxed to [facility] the denial letter.

## 2013-05-28 NOTE — Telephone Encounter (Signed)
Jeanette Estrada with [facility] called states they have not recd the denial letter, would like for it to be faxed over to her. Fax Attn:Jeanette Estrada in Rehab at [phone].

## 2013-06-08 NOTE — Telephone Encounter (Signed)
Jeanette Estrada call back.  Sent answers to questions.   Refaxed back 05-28-13.   Awaiting answer for approval (she stated could be up to 2 wks).  If we receive denial letter, to let her know.  [phone].

## 2013-06-08 NOTE — Telephone Encounter (Signed)
TC to Zenia Resides with [facility] about denial letter for power wheelchair.  LMVM.

## 2013-06-23 ENCOUNTER — Telehealth: Payer: Self-pay | Admitting: Diagnostic Neuroimaging

## 2013-06-23 NOTE — Telephone Encounter (Signed)
Jeanette Estrada from [place] has some concerns, please advise

## 2013-06-23 NOTE — Telephone Encounter (Signed)
Pam with [employer] called states she is going to fax a form over that will to be filled out. Pam also advised that our office needs to call [facility] at: [phone] concerning the status of the order and also any other information that they might need. Thanks

## 2013-06-23 NOTE — Telephone Encounter (Signed)
I called back.  Spoke with Jeanette Estrada.  She was not able to assist me.  She transferred me to Jeanette Estrada.  I provided additional info requested.  They will notify both us and the patient of te outcome once a decision is made.  Case # [id number]

## 2013-08-26 ENCOUNTER — Encounter: Payer: Self-pay | Admitting: Diagnostic Neuroimaging

## 2013-08-26 ENCOUNTER — Telehealth: Payer: Self-pay | Admitting: Diagnostic Neuroimaging

## 2013-08-26 NOTE — Telephone Encounter (Signed)
Spoke to patient regarding rescheduling 09/08/13 appointment per Dr. Richrd HumblesPenumalli's schedule, patient verbalized understanding, printed and mailed letter with new appointment time.

## 2013-09-08 ENCOUNTER — Ambulatory Visit: Payer: Medicare Other | Admitting: Diagnostic Neuroimaging

## 2013-10-19 ENCOUNTER — Other Ambulatory Visit: Payer: Self-pay

## 2013-10-19 MED ORDER — DIMETHYL FUMARATE 240 MG PO CPDR: 240.0000 mg | DELAYED_RELEASE_CAPSULE | Freq: Two times a day (BID) | ORAL | Status: DC

## 2013-10-26 ENCOUNTER — Ambulatory Visit: Payer: Medicare Other | Admitting: Diagnostic Neuroimaging

## 2013-10-26 ENCOUNTER — Encounter: Payer: Self-pay | Admitting: Diagnostic Neuroimaging

## 2013-10-27 ENCOUNTER — Telehealth: Payer: Self-pay

## 2013-10-27 NOTE — Telephone Encounter (Signed)
Spoke to patient. Scheduled f/u appt for 11/17/13 @ 10:00 arrival time 9:45.

## 2013-10-27 NOTE — Telephone Encounter (Addendum)
Called patient to reschedule f/u appt  with NP-LL at anytime. No answer. No vmail left.

## 2013-11-17 ENCOUNTER — Encounter: Payer: Self-pay | Admitting: Nurse Practitioner

## 2013-11-17 ENCOUNTER — Ambulatory Visit (INDEPENDENT_AMBULATORY_CARE_PROVIDER_SITE_OTHER): Payer: Medicare Other | Admitting: Nurse Practitioner

## 2013-11-17 ENCOUNTER — Ambulatory Visit: Payer: Self-pay | Admitting: Nurse Practitioner

## 2013-11-17 ENCOUNTER — Telehealth: Payer: Self-pay | Admitting: Neurology

## 2013-11-17 VITALS — BP 96/63 | HR 68 | Ht 68.5 in | Wt 143.4 lb

## 2013-11-17 DIAGNOSIS — G35 Multiple sclerosis: Secondary | ICD-10-CM | POA: Diagnosis not present

## 2013-11-17 NOTE — Progress Notes (Signed)
PATIENT: Jeanette Estrada DOB: [birth year]  REASON FOR VISIT: routine follow up for MS HISTORY FROM: patient  HISTORY OF PRESENT ILLNESS: UPDATE 11/17/13 (LL): Since last visit, patient has been doing well. She is tolerating Tecfidera well without significant stomach upset.  No new neurologic symptoms. She received her power wheelchair which stays in the house and she has a motorized scooter to go away from home. She is able to walk short distances but needs something to hold on to.  UPDATE 05/12/13: Patient presents for face-to-face evaluation of electric power wheelchair. Patient is accompanied by husband as well as representative from [facility]. Otherwise she's doing well. She's tolerating tecfidera medication. No new neurologic symptoms.  UPDATE 10/14/12: Since last visit, had MRI brain which showed progression of MS lesions. Symptoms slightly progressed (more swallow difficulty and double vision). Tolerating betaseron with some flu-like sxs.   UPDATE 04/01/12: Since last visit, has started on betaseron. Tolerating without HA. Some burning at injection sites. No new MS symptoms. Using rolling walker. Asking about ampyra.  UPDATE 12/31/11:  Has had 2 falls since last visit without injury.  has occasional numbness and difficulty with swallowing uses rolling walker. Vision is about the same.  Continues to have urine incontinence.  She was taking Oxybutinin in the hospital but has not taken any since June 2013.  She has not been evaluated by Urology.  She is getting an Mining engineer wheelchair later this month.  She uses her rolling walker at all times.  She did not have a JCV antibody lab done.    PRIOR HPI (10/31/11): 38 year old left-handed female here for evaluation of multiple sclerosis.  In 1999 patient had first episode of "eyes crossed" which was noted on a routine ophthalmology evaluation. She was then referred for prism glasses. However when she was using these glasses, her vision seemed  to be worse and therefore she stopped using the glasses. Around the same time she was beginning to develop balance and gait difficulty, increasing car sickness. She had a MRI scan at that time which showed "lesions". For some reason this was not followed up. Over the years, she continued to have progression of balance and gait difficulty, slurred speech, falling down, localized weakness of her face, arm or leg. These episodes would tend to last one to 2 weeks a time and occur up to 5 times per year. She continued to have emergency room visits, CT scans without a specific diagnosis.  In 2010, patient had significant increase in her falls and gait difficulty. From this point forward she needed to hold onto people, walls, furniture or railing for balance.  In 2012 patient was at home, had an episode of decreased responsiveness, whole body tremors and shaking, possible seizure; but no mention in hospital records.  In May 2013, patient had continued progression of symptoms, whole body tremors and shaking, and presented to the emergency room. She was evaluated with MRI of the brain and cervical spine. Extensive T2 hyperintense lesions were noted throughout the brain and spinal cord and presumptive diagnosis of multiple sclerosis was made. She was treated with IV Solu-Medrol for 5 days, and then transferred to inpatient rehabilitation for recovery. Since that time patient's symptoms have been fairly stable. She continues to struggle with significant urinary incontinence, gait difficulty, whole-body tremors when she stands.  REVIEW OF SYSTEMS: Full 14 system review of systems performed and notable only for gait instability, speech difficulty, tremor, anxiety.  ALLERGIES: No Known Allergies  HOME MEDICATIONS: Outpatient  Prescriptions Prior to Visit  Medication Sig Dispense Refill  . Dimethyl Fumarate (TECFIDERA) 240 MG CPDR Take 1 capsule (240 mg total) by mouth 2 (two) times daily.  180 capsule  0  .  ibuprofen (ADVIL,MOTRIN) 200 MG tablet Take 200 mg by mouth every 6 (six) hours as needed for pain.       No facility-administered medications prior to visit.    PHYSICAL EXAM Filed Vitals:   11/17/13 1505  BP: 96/63  Pulse: 68  Height: 5' 8.5" (1.74 m)  Weight: 143 lb 6.4 oz (65.046 kg)   Body mass index is 21.48 kg/(m^2).  GENERAL EXAM: General: Patient is awake, alert and in no acute distress.  Well developed and groomed. Cardiovascular: No carotid artery bruits.  Heart is regular rate and rhythm with no murmurs.  Neurologic Exam  Mental Status: Awake, alert. Language is mildly dysfluent, and comprehension intact. Cranial Nerves: Pupils are equal and reactive to light.   DECR LIGHT SENS IN LEFT EYE.  Visual fields are full to confrontation.  Conjugate eye movements are full and symmetric.  NYSTAGMUS ON RIGHT, LEFT, UP AND DOWN GAZE. SACCADIC BREAKDOWN OF SMOOTH PURSUIT. OVERSHOOT ON SACCADES. DECR SENS IN LEFT FACE. Facial strength are symmetric.  Hearing is intact.  Palate elevated symmetrically and uvula is midline.  Shoulder shrug is symmetric.  Tongue is midline. Motor: Normal bulk and tone.  BUE 4/5. BLE (HF 4/5). INCREASED TONE IN BLE.  Sensory: DECREASED IN LUE AND LLE.  Coordination: SLOW FTN WITH ATAXIA.  Gait and Station: SITTING IN [employer]. NOT ABLE TO WALK WITHOUT ASSISTANCE. WIDE BASED AND ATAXIC GAIT. Reflexes: Deep tendon reflexes in the upper and lower extremity are BRISK and symmetric. CROSSED ADDUCTORS.   08/12/11 MRI brain - extensive supratentorial and infratentorial chronic demyelinating plaques; single right inferior frontal enhancing lesion; numerous T1 black holes  08/12/11 MRI cervical spine - multiple spinal cord chronic demyelinating plaques  11/20/11 ANA, ACA, ACE, CBC with diff, CMP, HIV, HTLV (I, II), Hepatitis panel, NMO were all normal  07/06/12 MRI brain - extensive abnormal white matter changes consistent with advanced multiple sclerosis.  No enhancing lesions are noted. Presence of T1 black holes and atrophy of corpus callosum indicates chronic disease. Overall worsening of white matter disease compared with MRI scan dated 08/12/2011. At least one new lesion in left corona radiata.  ASSESSMENT: 38 y.o. female with multiple sclerosis. History, exam, MRI findings are all consistent with diagnosis of multiple sclerosis.  On betaseron since Nov 2013, now on tecfidera since August 2014.  PLAN: 1. check CBC and CMP, LFTs 2. Continue tecfidera 3. Follow up in 6 months, sooner as needed.  Orders Placed This Encounter  Procedures  . CBC With differential/Platelet  . Comprehensive metabolic panel  . Hepatic Function Panel   Tawny Asal Vandora Jaskulski, MSN, FNP-BC, A/GNP-C 11/17/2013, 8:35 PM [facility] [address] [place], [place] [address] [phone]  Note: This document was prepared with digital dictation and possible smart phrase technology. Any transcriptional errors that result from this process are unintentional.

## 2013-11-17 NOTE — Telephone Encounter (Signed)
Patient called to schedule a later appointment from 10am to 230pm with Larita Fife.

## 2013-11-17 NOTE — Patient Instructions (Addendum)
PLAN: 1. check CBC and CMP and LFTs. 2. Continue tecfidera 3. Follow up in 6 months, sooner as needed.

## 2013-11-18 LAB — COMPREHENSIVE METABOLIC PANEL
ALT: 6 IU/L (ref 0–32)
AST: 11 IU/L (ref 0–40)
Albumin/Globulin Ratio: 1.6 (ref 1.1–2.5)
Albumin: 4.2 g/dL (ref 3.5–5.5)
Alkaline Phosphatase: 72 IU/L (ref 39–117)
BUN/Creatinine Ratio: 11 (ref 8–20)
BUN: 7 mg/dL (ref 6–20)
CO2: 24 mmol/L (ref 18–29)
Calcium: 9.1 mg/dL (ref 8.7–10.2)
Chloride: 101 mmol/L (ref 97–108)
Creatinine, Ser: 0.63 mg/dL (ref 0.57–1.00)
GFR calc Af Amer: 133 mL/min/{1.73_m2} (ref >59)
GFR calc non Af Amer: 115 mL/min/{1.73_m2} (ref >59)
Globulin, Total: 2.6 g/dL (ref 1.5–4.5)
Glucose: 71 mg/dL (ref 65–99)
Potassium: 4.2 mmol/L (ref 3.5–5.2)
Sodium: 139 mmol/L (ref 134–144)
Total Bilirubin: 0.3 mg/dL (ref 0.0–1.2)
Total Protein: 6.8 g/dL (ref 6.0–8.5)

## 2013-11-18 LAB — CBC WITH DIFFERENTIAL
Basophils Absolute: 0 10*3/uL (ref 0.0–0.2)
Basos: 0 %
Eos: 1 %
Eosinophils Absolute: 0.1 10*3/uL (ref 0.0–0.4)
HCT: 37.2 % (ref 34.0–46.6)
Hemoglobin: 12.3 g/dL (ref 11.1–15.9)
Immature Grans (Abs): 0 10*3/uL (ref 0.0–0.1)
Immature Granulocytes: 0 %
Lymphocytes Absolute: 1.5 10*3/uL (ref 0.7–3.1)
Lymphs: 23 %
MCH: 28.8 pg (ref 26.6–33.0)
MCHC: 33.1 g/dL (ref 31.5–35.7)
MCV: 87 fL (ref 79–97)
Monocytes Absolute: 0.3 10*3/uL (ref 0.1–0.9)
Monocytes: 5 %
Neutrophils Absolute: 4.6 10*3/uL (ref 1.4–7.0)
Neutrophils Relative %: 71 %
Platelets: 245 10*3/uL (ref 150–379)
RBC: 4.27 x10E6/uL (ref 3.77–5.28)
RDW: 14 % (ref 12.3–15.4)
WBC: 6.4 10*3/uL (ref 3.4–10.8)

## 2013-11-18 LAB — HEPATIC FUNCTION PANEL: Bilirubin, Direct: 0.1 mg/dL (ref 0.00–0.40)

## 2013-11-19 ENCOUNTER — Telehealth: Payer: Self-pay | Admitting: Diagnostic Neuroimaging

## 2013-11-19 ENCOUNTER — Telehealth: Payer: Self-pay | Admitting: Nurse Practitioner

## 2013-11-19 MED ORDER — DIMETHYL FUMARATE 240 MG PO CPDR: 240.0000 mg | DELAYED_RELEASE_CAPSULE | Freq: Two times a day (BID) | ORAL | Status: DC

## 2013-11-19 NOTE — Telephone Encounter (Signed)
Rx has been sent.  I called the patient.  She is aware.  

## 2013-11-19 NOTE — Telephone Encounter (Signed)
Patient requesting Rx refill for Dimethyl Fumarate (TECFIDERA) 240 MG CPDR, fax to [employer] [facility].  Please call anytime and may leave detailed message on voice mail.

## 2013-11-19 NOTE — Telephone Encounter (Signed)
[name] from [facility] calling with questions about patient's Tecfidera, please call and advise.

## 2013-11-19 NOTE — Telephone Encounter (Signed)
I called back.  Spoke with [name].  Verified the Rx.  They will proceed with processing the order.  Nothing further is needed.

## 2013-11-23 NOTE — Progress Notes (Signed)
I reviewed note and agree with plan.   Kamryn Messineo R. Kamyiah Colantonio, MD 11/23/2013, 7:55 AM Certified in Neurology, Neurophysiology and Neuroimaging  [facility] [address], [address] [place], [place] [address] [phone]  

## 2014-02-03 ENCOUNTER — Encounter: Payer: Self-pay | Admitting: Diagnostic Neuroimaging

## 2014-04-15 ENCOUNTER — Telehealth: Payer: Self-pay | Admitting: *Deleted

## 2014-04-15 NOTE — Telephone Encounter (Signed)
Left a message for the pt to call back to the office and reschedule her follow up appt since Dr. Marjory Lies will not be in the office 05/18/14

## 2014-04-20 ENCOUNTER — Telehealth: Payer: Self-pay | Admitting: *Deleted

## 2014-04-20 NOTE — Telephone Encounter (Signed)
Called pt and her husband with no answer. Second attempt to call and reschedule follow-up appt since Dr. Marjory Lies will not be in the office 05/18/14

## 2014-05-18 ENCOUNTER — Ambulatory Visit: Payer: Medicare Other | Admitting: Diagnostic Neuroimaging

## 2014-05-18 ENCOUNTER — Ambulatory Visit: Payer: Medicare Other | Admitting: Nurse Practitioner

## 2014-07-14 ENCOUNTER — Encounter: Payer: Self-pay | Admitting: Diagnostic Neuroimaging

## 2014-07-14 ENCOUNTER — Ambulatory Visit (INDEPENDENT_AMBULATORY_CARE_PROVIDER_SITE_OTHER): Payer: Medicare Other | Admitting: Diagnostic Neuroimaging

## 2014-07-14 ENCOUNTER — Ambulatory Visit: Payer: Self-pay | Admitting: Diagnostic Neuroimaging

## 2014-07-14 VITALS — BP 94/62 | HR 61 | Ht 68.5 in | Wt 137.4 lb

## 2014-07-14 DIAGNOSIS — E559 Vitamin D deficiency, unspecified: Secondary | ICD-10-CM | POA: Diagnosis not present

## 2014-07-14 DIAGNOSIS — G35 Multiple sclerosis: Secondary | ICD-10-CM

## 2014-07-14 NOTE — Progress Notes (Signed)
PATIENT: Jeanette Estrada DOB: 1975-07-20  REASON FOR VISIT: routine follow up for MS HISTORY FROM: patient  Chief Complaint  Patient presents with  . Follow-up    Multiple Sclerosis     HISTORY OF PRESENT ILLNESS:  UPDATE 07/14/14 (VRP): Since last visit, sxs are stable. She is using power scooter for mobility. Tolerating tecfidera. No new issues.  UPDATE 11/17/13 (LL): Since last visit, patient has been doing well. She is tolerating Tecfidera well without significant stomach upset.  No new neurologic symptoms. She received her power wheelchair which stays in the house and she has a motorized scooter to go away from home. She is able to walk short distances but needs something to hold on to.  UPDATE 05/12/13 (VRP): Patient presents for face-to-face evaluation of electric power wheelchair. Patient is accompanied by husband as well as representative from [facility]. Otherwise she's doing well. She's tolerating tecfidera medication. No new neurologic symptoms.  UPDATE 10/14/12: Since last visit, had MRI brain which showed progression of MS lesions. Symptoms slightly progressed (more swallow difficulty and double vision). Tolerating betaseron with some flu-like sxs.   UPDATE 04/01/12: Since last visit, has started on betaseron. Tolerating without HA. Some burning at injection sites. No new MS symptoms. Using rolling walker. Asking about ampyra.  UPDATE 12/31/11:  Has had 2 falls since last visit without injury.  has occasional numbness and difficulty with swallowing uses rolling walker. Vision is about the same.  Continues to have urine incontinence.  She was taking Oxybutinin in the hospital but has not taken any since June 2013.  She has not been evaluated by Urology.  She is getting an Mining engineer wheelchair later this month.  She uses her rolling walker at all times.  She did not have a JCV antibody lab done.    PRIOR HPI (10/31/11): 39 year old left-handed female here for evaluation of  multiple sclerosis. In 1999 patient had first episode of "eyes crossed" which was noted on a routine ophthalmology evaluation. She was then referred for prism glasses. However when she was using these glasses, her vision seemed to be worse and therefore she stopped using the glasses. Around the same time she was beginning to develop balance and gait difficulty, increasing car sickness. She had a MRI scan at that time which showed "lesions". For some reason this was not followed up. Over the years, she continued to have progression of balance and gait difficulty, slurred speech, falling down, localized weakness of her face, arm or leg. These episodes would tend to last one to 2 weeks a time and occur up to 5 times per year. She continued to have emergency room visits, CT scans without a specific diagnosis. In 2010, patient had significant increase in her falls and gait difficulty. From this point forward she needed to hold onto people, walls, furniture or railing for balance. In 2012 patient was at home, had an episode of decreased responsiveness, whole body tremors and shaking, possible seizure; but no mention in hospital records. In May 2013, patient had continued progression of symptoms, whole body tremors and shaking, and presented to the emergency room. She was evaluated with MRI of the brain and cervical spine. Extensive T2 hyperintense lesions were noted throughout the brain and spinal cord and presumptive diagnosis of multiple sclerosis was made. She was treated with IV Solu-Medrol for 5 days, and then transferred to inpatient rehabilitation for recovery. Since that time patient's symptoms have been fairly stable. She continues to struggle with significant urinary  incontinence, gait difficulty, whole-body tremors when she stands.   REVIEW OF SYSTEMS: Full 14 system review of systems performed and notable only for tremor, daytime sleepiness.   ALLERGIES: No Known Allergies  HOME  MEDICATIONS: Outpatient Prescriptions Prior to Visit  Medication Sig Dispense Refill  . Dimethyl Fumarate (TECFIDERA) 240 MG CPDR Take 1 capsule (240 mg total) by mouth 2 (two) times daily. 180 capsule 1  . ibuprofen (ADVIL,MOTRIN) 200 MG tablet Take 200 mg by mouth every 6 (six) hours as needed for pain.     No facility-administered medications prior to visit.    PHYSICAL EXAM Filed Vitals:   07/14/14 1246  BP: 94/62  Pulse: 61  Height: 5' 8.5" (1.74 m)  Weight: 137 lb 6.4 oz (62.324 kg)   Body mass index is 20.59 kg/(m^2).  GENERAL EXAM: General: Patient is awake, alert and in no acute distress.  Well developed and groomed. Cardiovascular: No carotid artery bruits.  Heart is regular rate and rhythm with no murmurs.  Neurologic Exam  Mental Status: Awake, alert. Language is mildly dysfluent, and comprehension intact. Cranial Nerves: Pupils are equal and reactive to light.   DECR LIGHT SENS IN LEFT EYE.  Visual fields are full to confrontation.  Conjugate eye movements are full and symmetric.  NYSTAGMUS ON RIGHT, LEFT, UP AND DOWN GAZE. SACCADIC BREAKDOWN OF SMOOTH PURSUIT. OVERSHOOT ON SACCADES. DECR SENS IN LEFT FACE. Facial strength are symmetric.  Hearing is intact.  Palate elevated symmetrically and uvula is midline.  Shoulder shrug is symmetric.  Tongue is midline. MILD DYSARTHRIA. Motor: Normal bulk and tone.  BUE 4/5. BLE (HF 3/5; RIGHT DF 4, LEFT DF 3). INCREASED TONE IN BLE.  Sensory: DECREASED IN LUE AND LLE.  Coordination: SLOW FTN WITH ATAXIA.  Reflexes: Deep tendon reflexes in the upper and lower extremity are BRISK and symmetric. CROSSED ADDUCTORS. Gait and Station: SITTING IN POWER SCOOTER. NOT ABLE TO WALK WITHOUT ASSISTANCE.     DIAGNOSTICS/IMAGING  08/12/11 MRI brain - extensive supratentorial and infratentorial chronic demyelinating plaques; single right inferior frontal enhancing lesion; numerous T1 black holes  08/12/11 MRI cervical spine - multiple spinal  cord chronic demyelinating plaques  11/20/11 ANA, ACA, ACE, CBC with diff, CMP, HIV, HTLV (I, II), Hepatitis panel, NMO were all normal  07/06/12 MRI brain - extensive abnormal white matter changes consistent with advanced multiple sclerosis. No enhancing lesions are noted. Presence of T1 black holes and atrophy of corpus callosum indicates chronic disease. Overall worsening of white matter disease compared with MRI scan dated 08/12/2011. At least one new lesion in left corona radiata.    ASSESSMENT/PLAN 39 y.o. female with multiple sclerosis. History, exam, MRI findings are all consistent with diagnosis of multiple sclerosis.  On betaseron since Nov 2013, now on tecfidera since August 2014. Overall stable.  PLAN: 1. labs 2. MRI brain  3. continue tecfidera  Orders Placed This Encounter  Procedures  . CBC with Differential/Platelet  . Comprehensive metabolic panel  . Vit D  25 hydroxy (rtn osteoporosis monitoring)   Return in about 6 months (around 01/13/2015).   I spent 15 minutes of face to face time with patient. Greater than 50% of time was spent in counseling and coordination of care with patient.    Suanne Marker, MD 07/14/2014, 1:21 PM Certified in Neurology, Neurophysiology and Neuroimaging  [facility] [address] [place], [place] [address] [phone]

## 2014-07-15 LAB — CBC WITH DIFFERENTIAL/PLATELET
BASOS ABS: 0 10*3/uL (ref 0.0–0.2)
Basos: 0 %
EOS (ABSOLUTE): 0 10*3/uL (ref 0.0–0.4)
Eos: 1 %
HEMATOCRIT: 36.9 % (ref 34.0–46.6)
Hemoglobin: 12.1 g/dL (ref 11.1–15.9)
IMMATURE GRANS (ABS): 0 10*3/uL (ref 0.0–0.1)
Immature Granulocytes: 0 %
LYMPHS ABS: 1.1 10*3/uL (ref 0.7–3.1)
LYMPHS: 18 %
MCH: 29.1 pg (ref 26.6–33.0)
MCHC: 32.8 g/dL (ref 31.5–35.7)
MCV: 89 fL (ref 79–97)
Monocytes Absolute: 0.4 10*3/uL (ref 0.1–0.9)
Monocytes: 6 %
NEUTROS PCT: 75 %
Neutrophils Absolute: 4.6 10*3/uL (ref 1.4–7.0)
Platelets: 224 10*3/uL (ref 150–379)
RBC: 4.16 x10E6/uL (ref 3.77–5.28)
RDW: 13.7 % (ref 12.3–15.4)
WBC: 6.1 10*3/uL (ref 3.4–10.8)

## 2014-07-15 LAB — COMPREHENSIVE METABOLIC PANEL
ALBUMIN: 4.2 g/dL (ref 3.5–5.5)
ALT: 6 IU/L (ref 0–32)
AST: 10 IU/L (ref 0–40)
Albumin/Globulin Ratio: 2 (ref 1.1–2.5)
Alkaline Phosphatase: 66 IU/L (ref 39–117)
BUN/Creatinine Ratio: 11 (ref 8–20)
BUN: 8 mg/dL (ref 6–20)
Bilirubin Total: 0.3 mg/dL (ref 0.0–1.2)
CO2: 24 mmol/L (ref 18–29)
Calcium: 8.8 mg/dL (ref 8.7–10.2)
Chloride: 103 mmol/L (ref 97–108)
Creatinine, Ser: 0.74 mg/dL (ref 0.57–1.00)
GFR calc Af Amer: 119 mL/min/{1.73_m2} (ref >59)
GFR, EST NON AFRICAN AMERICAN: 103 mL/min/{1.73_m2} (ref >59)
Globulin, Total: 2.1 g/dL (ref 1.5–4.5)
Glucose: 74 mg/dL (ref 65–99)
Potassium: 4.3 mmol/L (ref 3.5–5.2)
Sodium: 140 mmol/L (ref 134–144)
Total Protein: 6.3 g/dL (ref 6.0–8.5)

## 2014-07-15 LAB — VITAMIN D 25 HYDROXY (VIT D DEFICIENCY, FRACTURES): Vit D, 25-Hydroxy: 24 ng/mL — ABNORMAL LOW (ref 30.0–100.0)

## 2014-07-21 ENCOUNTER — Encounter: Payer: Self-pay | Admitting: *Deleted

## 2015-01-16 ENCOUNTER — Ambulatory Visit: Payer: Medicare Other | Admitting: Diagnostic Neuroimaging

## 2015-01-17 ENCOUNTER — Encounter: Payer: Self-pay | Admitting: Diagnostic Neuroimaging

## 2015-02-27 ENCOUNTER — Telehealth: Payer: Self-pay | Admitting: *Deleted

## 2015-02-27 NOTE — Telephone Encounter (Addendum)
LVM requesting call back. Left this caller's name, number.  02/27/15 Received call back from patient. Inquired about Rx request this office has received for catheter supplies. Patient states she has been receiving catheter supplies without difficulty. She states "I get calls from insurance companies all the time wanting to send me supplies. I tell them I don't need the supplies."  She stated that the faxes Dr Marjory Lies is receiving do not need to be returned. Informed her that she needs a FU appt. She stated she didn't come for last FU in Oct because she didn't have anyone to come with her and help her. She further stated "I am doing so good." Advised that she still needs to be seen at least yearly for medication review. Scheduled her for April 2017 30 min FU; will route to D Penumalli for his review.

## 2015-02-28 NOTE — Telephone Encounter (Signed)
error 

## 2015-03-15 ENCOUNTER — Telehealth: Payer: Self-pay | Admitting: Diagnostic Neuroimaging

## 2015-03-15 NOTE — Telephone Encounter (Signed)
Ins has been contacted and provided with clinical info requesting that they grant an exception for coverage on this medication.  Request is under review Ref # [id number]  Monia Pouch has approved the request for coverage on Tecfidera effective until 03/17/2016 Ref # [id number] Ref ID# [facility]

## 2015-03-15 NOTE — Telephone Encounter (Signed)
Shanda Bumps with [employer] [phone]  Dimethyl Fumarate (TECFIDERA) 240 MG CPDR is non-formulary and the alternative is Gilyena. If provider feels this is appropriate for pt a new RX can be sent directly to pt pharmacy which is [facility] [phone]). These medications do require prior auth -phone # [phone]

## 2015-03-28 ENCOUNTER — Telehealth: Payer: Self-pay | Admitting: Diagnostic Neuroimaging

## 2015-03-28 NOTE — Telephone Encounter (Signed)
Patient called back regarding DMV forms, wants to know if we received her forms and when will someone call her back about it. Patient advised, previous message was forwarded to nurse, nurse will be in touch with her regarding this.

## 2015-03-28 NOTE — Telephone Encounter (Signed)
Pt called and says she mailed her DMV forms last Thursday, 03/23/15 , via [employer].She wants to know if our office received the forms. Please call [phone]

## 2015-03-28 NOTE — Telephone Encounter (Signed)
Will route this question to MR dept where forms would have come into office.

## 2015-03-28 NOTE — Telephone Encounter (Signed)
Received DMV form, patient paid,  to nurse to be completed 03-28-15.

## 2015-03-29 ENCOUNTER — Telehealth: Payer: Self-pay | Admitting: *Deleted

## 2015-03-29 DIAGNOSIS — Z0289 Encounter for other administrative examinations: Secondary | ICD-10-CM

## 2015-03-29 NOTE — Telephone Encounter (Signed)
Patient form on Mary Chesson desk.  

## 2015-03-30 NOTE — Telephone Encounter (Signed)
Forms completed, signed by Dr Marjory Lies. Sent to MR for scanning, processing.

## 2015-03-30 NOTE — Telephone Encounter (Addendum)
LVM requesting call back to answer questions re: DMV forms. Left this caller's name, number.  10:52 am  Patient returned call. She stated she has already renewed her DL, and she had to do a road test. She stated she was in her power wheelchair at the time. She stated DMV staff told her the form is "procedure" and must be completed by her physician.

## 2015-03-31 ENCOUNTER — Telehealth: Payer: Self-pay | Admitting: *Deleted

## 2015-03-31 NOTE — Telephone Encounter (Signed)
DMV form faxed on 03/31/15.

## 2015-07-10 ENCOUNTER — Ambulatory Visit (INDEPENDENT_AMBULATORY_CARE_PROVIDER_SITE_OTHER): Payer: Medicare Other | Admitting: Diagnostic Neuroimaging

## 2015-07-10 ENCOUNTER — Ambulatory Visit: Payer: Self-pay | Admitting: Diagnostic Neuroimaging

## 2015-07-10 ENCOUNTER — Encounter: Payer: Self-pay | Admitting: Diagnostic Neuroimaging

## 2015-07-10 VITALS — BP 94/62 | HR 74 | Ht 68.5 in | Wt 135.0 lb

## 2015-07-10 DIAGNOSIS — E559 Vitamin D deficiency, unspecified: Secondary | ICD-10-CM | POA: Diagnosis not present

## 2015-07-10 DIAGNOSIS — R269 Unspecified abnormalities of gait and mobility: Secondary | ICD-10-CM

## 2015-07-10 DIAGNOSIS — G35 Multiple sclerosis: Secondary | ICD-10-CM | POA: Diagnosis not present

## 2015-07-10 NOTE — Patient Instructions (Signed)
Thank you for coming to see [place] at [facility]. I hope we have been able to provide you high quality care today.  You may receive a patient satisfaction survey over the next few weeks. We would appreciate your feedback and comments so that we may continue to improve ourselves and the health of our patients.  - continue tecfidera and vitamin D - consider ocrevus infusion   ~~~~~~~~~~~~~~~~~~~~~~~~~~~~~~~~~~~~~~~~~~~~~~~~~~~~~~~~~~~~~~~~~  DR. Jery Hollern'S GUIDE TO HAPPY AND HEALTHY LIVING These are some of my general health and wellness recommendations. Some of them may apply to you better than others. Please use common sense as you try these suggestions and feel free to ask me any questions.   ACTIVITY/FITNESS Mental, social, emotional and physical stimulation are very important for brain and body health. Try learning a new activity (arts, music, language, sports, games).  Keep moving your body to the best of your abilities. You can do this at home, inside or outside, the park, community center, gym or anywhere you like. Consider a physical therapist or personal trainer to get started. Consider the app Sworkit. Fitness trackers such as smart-watches, smart-phones or Fitbits can help as well.   NUTRITION Eat more plants: colorful vegetables, nuts, seeds and berries.  Eat less sugar, salt, preservatives and processed foods.  Avoid toxins such as cigarettes and alcohol.  Drink water when you are thirsty. Warm water with a slice of lemon is an excellent morning drink to start the day.  Consider these websites for more information The Nutrition Source (https://www.henry-hernandez.biz/) Precision Nutrition (WindowBlog.ch)   RELAXATION Consider practicing mindfulness meditation or other relaxation techniques such as deep breathing, prayer, yoga, tai chi, massage. See website mindful.org or the apps Headspace or Calm to help get  started.   SLEEP Try to get at least 7-8+ hours sleep per day. Regular exercise and reduced caffeine will help you sleep better. Practice good sleep hygeine techniques. See website sleep.org for more information.   PLANNING Prepare estate planning, living will, healthcare POA documents. Sometimes this is best planned with the help of an attorney. Theconversationproject.org and agingwithdignity.org are excellent resources.

## 2015-07-10 NOTE — Progress Notes (Signed)
PATIENT: Jeanette Estrada DOB: September 01, 1975  REASON FOR VISIT: routine follow up for MS HISTORY FROM: patient  Chief Complaint  Patient presents with  . Multiple Sclerosis    rm 6, husband- Wayne Sever, 06/2012 MRI Brain, " I am doing a little more because I think I can; tremors lessened but still present when changing positions"  . Follow-up    HISTORY OF PRESENT ILLNESS:  UPDATE 07/10/15 (VRP): Since last visit overall stable to slightly improved in functioning.Tolerating tecfidera. No new sxs.   UPDATE 07/14/14 (VRP): Since last visit, sxs are stable. She is using power scooter for mobility. Tolerating tecfidera. No new issues.  UPDATE 11/17/13 (LL): Since last visit, patient has been doing well. She is tolerating Tecfidera well without significant stomach upset.  No new neurologic symptoms. She received her power wheelchair which stays in the house and she has a motorized scooter to go away from home. She is able to walk short distances but needs something to hold on to.  UPDATE 05/12/13 (VRP): Patient presents for face-to-face evaluation of electric power wheelchair. Patient is accompanied by husband as well as representative from [facility]. Otherwise she's doing well. She's tolerating tecfidera medication. No new neurologic symptoms.  UPDATE 10/14/12: Since last visit, had MRI brain which showed progression of MS lesions. Symptoms slightly progressed (more swallow difficulty and double vision). Tolerating betaseron with some flu-like sxs.   UPDATE 04/01/12: Since last visit, has started on betaseron. Tolerating without HA. Some burning at injection sites. No new MS symptoms. Using rolling walker. Asking about ampyra.  UPDATE 12/31/11:  Has had 2 falls since last visit without injury.  has occasional numbness and difficulty with swallowing uses rolling walker. Vision is about the same.  Continues to have urine incontinence.  She was taking Oxybutinin in the hospital but has  not taken any since June 2013.  She has not been evaluated by Urology.  She is getting an Mining engineer wheelchair later this month.  She uses her rolling walker at all times.  She did not have a JCV antibody lab done.    PRIOR HPI (10/31/11): 40 year old left-handed female here for evaluation of multiple sclerosis. In 1999 patient had first episode of "eyes crossed" which was noted on a routine ophthalmology evaluation. She was then referred for prism glasses. However when she was using these glasses, her vision seemed to be worse and therefore she stopped using the glasses. Around the same time she was beginning to develop balance and gait difficulty, increasing car sickness. She had a MRI scan at that time which showed "lesions". For some reason this was not followed up. Over the years, she continued to have progression of balance and gait difficulty, slurred speech, falling down, localized weakness of her face, arm or leg. These episodes would tend to last one to 2 weeks a time and occur up to 5 times per year. She continued to have emergency room visits, CT scans without a specific diagnosis. In 2010, patient had significant increase in her falls and gait difficulty. From this point forward she needed to hold onto people, walls, furniture or railing for balance. In 2012 patient was at home, had an episode of decreased responsiveness, whole body tremors and shaking, possible seizure; but no mention in hospital records. In May 2013, patient had continued progression of symptoms, whole body tremors and shaking, and presented to the emergency room. She was evaluated with MRI of the brain and cervical spine. Extensive T2 hyperintense lesions were  noted throughout the brain and spinal cord and presumptive diagnosis of multiple sclerosis was made. She was treated with IV Solu-Medrol for 5 days, and then transferred to inpatient rehabilitation for recovery. Since that time patient's symptoms have been fairly stable. She  continues to struggle with significant urinary incontinence, gait difficulty, whole-body tremors when she stands.   REVIEW OF SYSTEMS: Full 14 system review of systems performed and negative except for tremors.   ALLERGIES: No Known Allergies  HOME MEDICATIONS: Outpatient Prescriptions Prior to Visit  Medication Sig Dispense Refill  . cholecalciferol (VITAMIN D) 1000 UNITS tablet Take 1,000 Units by mouth daily.    . Dimethyl Fumarate (TECFIDERA) 240 MG CPDR Take 1 capsule (240 mg total) by mouth 2 (two) times daily. 180 capsule 1  . ibuprofen (ADVIL,MOTRIN) 200 MG tablet Take 200 mg by mouth every 6 (six) hours as needed for pain.     No facility-administered medications prior to visit.    PHYSICAL EXAM Filed Vitals:   07/10/15 1329  BP: 94/62  Pulse: 74  Height: 5' 8.5" (1.74 m)  Weight: 135 lb (61.236 kg)   Body mass index is 20.23 kg/(m^2).  GENERAL EXAM: General: Patient is awake, alert and in no acute distress.  Well developed and groomed. Cardiovascular: No carotid artery bruits.  Heart is regular rate and rhythm with no murmurs.  Neurologic Exam  Mental Status: Awake, alert. Language is mildly dysfluent, and comprehension intact. Cranial Nerves: Pupils are equal and reactive to light.   DECR LIGHT SENS IN LEFT EYE.  Visual fields are full to confrontation.  Conjugate eye movements are full and symmetric.  NYSTAGMUS ON RIGHT, LEFT, UP AND DOWN GAZE. SACCADIC BREAKDOWN OF SMOOTH PURSUIT. OVERSHOOT ON SACCADES. DECR SENS IN LEFT FACE. Facial strength are symmetric.  Hearing is intact.  Palate elevated symmetrically and uvula is midline.  Shoulder shrug is symmetric.  Tongue is midline. MILD DYSARTHRIA. Motor: Normal bulk and tone.  BUE 4/5. BLE (HF 3/5; RIGHT DF 4, LEFT DF 3). INCREASED TONE IN BLE.  Sensory: DECREASED IN LUE AND LLE.  Coordination: SLOW FTN WITH ATAXIA.  Reflexes: Deep tendon reflexes in the upper and lower extremity are BRISK and symmetric. CROSSED  ADDUCTORS. Gait and Station: SITTING IN POWER SCOOTER. NOT ABLE TO WALK WITHOUT ASSISTANCE.     DIAGNOSTICS/IMAGING  CBC Latest Ref Rng 07/14/2014 11/17/2013 05/12/2013  WBC 3.4 - 10.8 x10E3/uL 6.1 6.4 6.5  Hemoglobin 11.1 - 15.9 g/dL - 93.9 03.0  Hematocrit 34.0 - 46.6 % 36.9 37.2 36.5  Platelets 150 - 379 x10E3/uL 224 245 246   Lab Results  Component Value Date   WBC 6.1 07/14/2014   HGB 12.3 11/17/2013   HCT 36.9 07/14/2014   MCV 89 07/14/2014   PLT 224 07/14/2014   LYMPHOCYTES ABSOLUTE  Date Value Ref Range Status  07/14/2014 1.1 0.7 - 3.1 x10E3/uL Final  11/17/2013 1.5 0.7 - 3.1 x10E3/uL Final  05/12/2013 1.5 0.7 - 3.1 x10E3/uL Final   LYMPHS ABS  Date Value Ref Range Status  08/15/2011 0.9 0.7 - 4.0 K/uL Final  08/11/2011 4.2* 0.7 - 4.0 K/uL Final   CMP Latest Ref Rng 07/14/2014 11/17/2013 08/15/2011  Glucose 65 - 99 mg/dL 74 71 092(Z)  BUN 6 - 20 mg/dL 8 7 13   Creatinine 0.57 - 1.00 mg/dL 3.00 7.62 2.63  Sodium 134 - 144 mmol/L 140 139 140  Potassium 3.5 - 5.2 mmol/L 4.3 4.2 3.9  Chloride 97 - 108 mmol/L 103 101 103  CO2  18 - 29 mmol/L 24 24 25   Calcium 8.7 - 10.2 mg/dL 8.8 9.1 9.2  Total Protein 6.0 - 8.5 g/dL 6.3 6.8 6.6  Total Bilirubin 0.0 - 1.2 mg/dL 0.3 0.3 9.1(Y)  Alkaline Phos 39 - 117 IU/L 66 72 64  AST 0 - 40 IU/L 10 11 30   ALT 0 - 32 IU/L 6 6 62(H)   VIT D, 25-HYDROXY  Date Value Ref Range Status  07/14/2014 24.0* 30.0 - 100.0 ng/mL Final    Comment:    Vitamin D deficiency has been defined by the Institute of Medicine and an Endocrine Society practice guideline as a level of serum 25-OH vitamin D less than 20 ng/mL (1,2). The Endocrine Society went on to further define vitamin D insufficiency as a level between 21 and 29 ng/mL (2). 1. IOM (Institute of Medicine). 2010. Dietary reference    intakes for calcium and D. [place]: The    [employer]. 2. Holick MF, Binkley [place], Bischoff-Ferrari HA, et al.    Evaluation, treatment, and  prevention of vitamin D    deficiency: an Endocrine Society clinical practice    guideline. JCEM. 2011 Jul; 96(7):1911-30.   05/12/2013 40.9 30.0 - 100.0 ng/mL Final    Comment:    Vitamin D deficiency has been defined by the Institute of Medicine and an Endocrine Society practice guideline as a level of serum 25-OH vitamin D less than 20 ng/mL (1,2). The Endocrine Society went on to further define vitamin D insufficiency as a level between 21 and 29 ng/mL (2). 1. IOM (Institute of Medicine). 2010. Dietary reference    intakes for calcium and D. [place]: The    [employer]. 2. Holick MF, Binkley , Bischoff-Ferrari HA, et al.    Evaluation, treatment, and prevention of vitamin D    deficiency: an Endocrine Society clinical practice    guideline. JCEM. 2011 Jul; 96(7):1911-30.   08/12/11 MRI brain - extensive supratentorial and infratentorial chronic demyelinating plaques; single right inferior frontal enhancing lesion; numerous T1 black holes  08/12/11 MRI cervical spine - multiple spinal cord chronic demyelinating plaques  11/20/11 ANA, ACA, ACE, CBC with diff, CMP, HIV, HTLV (I, II), Hepatitis panel, NMO were all normal  07/06/12 MRI brain - extensive abnormal white matter changes consistent with advanced multiple sclerosis. No enhancing lesions are noted. Presence of T1 black holes and atrophy of corpus callosum indicates chronic disease. Overall worsening of white matter disease compared with MRI scan dated 08/12/2011. At least one new lesion in left corona radiata.    ASSESSMENT/PLAN 40 y.o. female with multiple sclerosis. History, exam, MRI findings are all consistent with diagnosis of multiple sclerosis.  On betaseron since Nov 2013, now on tecfidera since August 2014 due to symptom and MRI progression on betaseron. Now stable on tecfidera.    Dx:  Multiple sclerosis (HCC) - Plan: MR Brain W Wo Contrast, CBC with Differential/Platelet, Comprehensive metabolic  panel, VITAMIN D 25 Hydroxy (Vit-D Deficiency, Fractures)  Vitamin D deficiency - Plan: MR Brain W Wo Contrast, CBC with Differential/Platelet, Comprehensive metabolic panel, VITAMIN D 25 Hydroxy (Vit-D Deficiency, Fractures)  Gait difficulty - Plan: MR Brain W Wo Contrast, CBC with Differential/Platelet, Comprehensive metabolic panel, VITAMIN D 25 Hydroxy (Vit-D Deficiency, Fractures)    PLAN: - check MRI brain and labs - continue tecfidera; may consider ocrevus infusion pending MRI - continue vitamin D  Orders Placed This Encounter  Procedures  . MR Brain W Wo Contrast  . CBC with  Differential/Platelet  . Comprehensive metabolic panel  . VITAMIN D 25 Hydroxy (Vit-D Deficiency, Fractures)   Return in about 6 months (around 01/09/2016).     Suanne Marker, MD 07/10/2015, 2:32 PM Certified in Neurology, Neurophysiology and Neuroimaging  [facility] [address], [address] [place], [place] [address] [phone]

## 2015-07-11 LAB — COMPREHENSIVE METABOLIC PANEL
A/G RATIO: 1.8 (ref 1.2–2.2)
ALBUMIN: 4.3 g/dL (ref 3.5–5.5)
ALT: 11 IU/L (ref 0–32)
AST: 12 IU/L (ref 0–40)
Alkaline Phosphatase: 73 IU/L (ref 39–117)
BUN / CREAT RATIO: 14 (ref 9–23)
BUN: 8 mg/dL (ref 6–20)
Bilirubin Total: 0.4 mg/dL (ref 0.0–1.2)
CALCIUM: 8.8 mg/dL (ref 8.7–10.2)
CO2: 23 mmol/L (ref 18–29)
Chloride: 103 mmol/L (ref 96–106)
Creatinine, Ser: 0.58 mg/dL (ref 0.57–1.00)
GFR calc Af Amer: 134 mL/min/{1.73_m2} (ref >59)
GFR, EST NON AFRICAN AMERICAN: 116 mL/min/{1.73_m2} (ref >59)
GLOBULIN, TOTAL: 2.4 g/dL (ref 1.5–4.5)
Glucose: 76 mg/dL (ref 65–99)
POTASSIUM: 4.5 mmol/L (ref 3.5–5.2)
SODIUM: 139 mmol/L (ref 134–144)
TOTAL PROTEIN: 6.7 g/dL (ref 6.0–8.5)

## 2015-07-11 LAB — CBC WITH DIFFERENTIAL/PLATELET
BASOS: 0 %
Basophils Absolute: 0 10*3/uL (ref 0.0–0.2)
EOS (ABSOLUTE): 0.1 10*3/uL (ref 0.0–0.4)
EOS: 1 %
HEMATOCRIT: 37.9 % (ref 34.0–46.6)
Hemoglobin: 12.2 g/dL (ref 11.1–15.9)
IMMATURE GRANS (ABS): 0 10*3/uL (ref 0.0–0.1)
IMMATURE GRANULOCYTES: 0 %
Lymphocytes Absolute: 1.6 10*3/uL (ref 0.7–3.1)
Lymphs: 20 %
MCH: 28.2 pg (ref 26.6–33.0)
MCHC: 32.2 g/dL (ref 31.5–35.7)
MCV: 88 fL (ref 79–97)
MONOS ABS: 0.4 10*3/uL (ref 0.1–0.9)
Monocytes: 6 %
NEUTROS PCT: 73 %
Neutrophils Absolute: 5.8 10*3/uL (ref 1.4–7.0)
Platelets: 260 10*3/uL (ref 150–379)
RBC: 4.32 x10E6/uL (ref 3.77–5.28)
RDW: 14.8 % (ref 12.3–15.4)
WBC: 7.9 10*3/uL (ref 3.4–10.8)

## 2015-07-11 LAB — VITAMIN D 25 HYDROXY (VIT D DEFICIENCY, FRACTURES): VIT D 25 HYDROXY: 37.1 ng/mL (ref 30.0–100.0)

## 2015-07-17 ENCOUNTER — Other Ambulatory Visit: Payer: Medicare Other

## 2015-07-18 ENCOUNTER — Other Ambulatory Visit: Payer: Medicare Other

## 2015-07-19 ENCOUNTER — Telehealth: Payer: Self-pay | Admitting: *Deleted

## 2015-07-19 NOTE — Telephone Encounter (Signed)
LVM informing patient her lab results are normal. Left name, number for any questions.

## 2015-07-23 ENCOUNTER — Ambulatory Visit
Admission: RE | Admit: 2015-07-23 | Discharge: 2015-07-23 | Disposition: A | Discharge status: Home / Self Care | Payer: Medicare Other | Source: Ambulatory Visit | Attending: Diagnostic Neuroimaging | Admitting: Diagnostic Neuroimaging

## 2015-07-23 DIAGNOSIS — E559 Vitamin D deficiency, unspecified: Secondary | ICD-10-CM

## 2015-07-23 DIAGNOSIS — G35 Multiple sclerosis: Secondary | ICD-10-CM

## 2015-07-23 DIAGNOSIS — R269 Unspecified abnormalities of gait and mobility: Secondary | ICD-10-CM

## 2015-07-23 MED ORDER — GADOBENATE DIMEGLUMINE 529 MG/ML IV SOLN
12.0000 mL | Freq: Once | INTRAVENOUS | Status: AC | PRN
  Administered 2015-07-23: 12 mL via INTRAVENOUS

## 2015-08-03 ENCOUNTER — Telehealth: Payer: Self-pay | Admitting: Diagnostic Neuroimaging

## 2015-08-03 NOTE — Telephone Encounter (Signed)
Patient is calling to get to get MRI results.

## 2015-08-04 NOTE — Telephone Encounter (Signed)
MRI is stable. Continue current plan. -VRP

## 2015-08-04 NOTE — Telephone Encounter (Signed)
Per Dr Marjory Lies, spoke with patient and informed her that her MRI is stable. Continue current plan, medications. She verbalized understanding, appreciation.

## 2015-10-06 ENCOUNTER — Telehealth: Payer: Self-pay | Admitting: Diagnostic Neuroimaging

## 2015-10-06 DIAGNOSIS — R269 Unspecified abnormalities of gait and mobility: Secondary | ICD-10-CM

## 2015-10-06 DIAGNOSIS — G35 Multiple sclerosis: Secondary | ICD-10-CM

## 2015-10-06 NOTE — Telephone Encounter (Signed)
Patient is calling stating she is in need of a motorized scooter or wheelchair.

## 2015-10-09 NOTE — Telephone Encounter (Signed)
Patient returned Mary Clare's call. °

## 2015-10-09 NOTE — Telephone Encounter (Signed)
Spoke with husband re: clarification of patient's request. At her last office visit she was in a power scooter. He stated she was unable to come to the phone, but he would have her call back. He has office number.  Received call back from patient who stated her power scooter is no longer operating. She stated she has been told that an order needs to be sent to Medicare. Advised her this RN was not familiar with that process, will discuss with another RN and call her back today. She verbalized understanding, appreciation.  4:52 pm  After speaking with RN and 2 PTs in Neuro rehab, was unable to get further information re: power scooter order. Raynelle Fanning, PT stated she would have Angie PT call tomorrow. This RN then spoke with patient to update her. She stated she was going to call Medicare to get further information re: fax number to send order/prescription.  She stated she will call back tomorrow. She verbalized appreciation for this call.

## 2015-10-10 NOTE — Telephone Encounter (Signed)
Patient called and stated she spoke with someone at [facility] who advised the power scooter , mobility deviceorder needs to be faxed to [employer] in [place] where patient lives.  [facility] fax [phone]. Order signed, successfully faxed to [employer].

## 2015-10-10 NOTE — Telephone Encounter (Signed)
Patient is calling and states the company she previously gave you does not have the motorized scooter or wheelchair.  She would like to offer this company, [employer], [address], [address], [place], [place] [address]. Phone [phone]. Thanks!

## 2015-10-10 NOTE — Telephone Encounter (Signed)
Called [facility] [employer] [facility] for fax number; receptionist will have to call back.   Called back and was told this company does not provide power scooters, only provides patient with transportation. Called patient and LVM informing her of same.   10/11/15 returned call to patient. She stated she got message re: [employer] does not provide scooters. She stated she has found a company that she wants order faxed, [employer] Informed her this RN will fax order today. She verbalized understanding, appreciation.

## 2015-10-11 NOTE — Telephone Encounter (Signed)
Erica/[facility] [phone] called to advise, patient received power chair in 2015, patient doesn't qualify for another 3 years.

## 2015-10-11 NOTE — Telephone Encounter (Signed)
Patient is returning a call. °

## 2015-10-11 NOTE — Telephone Encounter (Signed)
Patient is calling stating the Rx needs to be sent to [facility] for a scooter repair.

## 2015-10-11 NOTE — Telephone Encounter (Addendum)
Spoke with [facility] who stated patient received a specialty power wheelchair in 2015. Per her Medicare insurance, she may only receive a power chair every 8 years, therefore she does not qualify. Alcario Drought stated patient may choose to pay for it herself.  Called patient to explain , and she stated she was on another phone with [facility], will call back. 10/11/15 3:40 pm New order for repair of pawer scooter successfully faxed to [facility].

## 2015-10-11 NOTE — Addendum Note (Signed)
Addended by: Maryland Pink on: 10/11/2015 01:53 PM   Modules accepted: Orders

## 2015-11-21 ENCOUNTER — Telehealth: Payer: Self-pay | Admitting: Diagnostic Neuroimaging

## 2015-11-21 NOTE — Telephone Encounter (Signed)
Patient called to check status of prescription for Mobility Scooter. Please call to advise.

## 2015-11-21 NOTE — Telephone Encounter (Signed)
Spoke with patient who stated she hasn't heard about her scooter. Informed her that order was faxed to [facility] at the end of July at her request, and this RN would not have status of that order. Advised she call [facility]. Patient stated she understood from Medicare that a new prescription for power scooter needs to be faxed to them. She stated she paid out of pocket for her first one, so insurance should pay for a new one. She stated she has proof of that. She is aware Medicare only pays for one every three years. Advised she needs to be sure that she and insurance are talking about same DME; scooter vs wheelchair. She then stated she is going to call scooter round and discuss with them what she needs to obtain new scooter. She will call back. She verbalized appreciation of call.

## 2015-11-24 NOTE — Telephone Encounter (Signed)
Called patient who stated this RN will receive a faxed form from [employer] to complete to enable patient to obtain scooter paid for by her insurance. She stated she gave person this RN's name. Informed her the form will be filled out when available. She verbalized understanding, appreciation.

## 2015-11-24 NOTE — Telephone Encounter (Signed)
Patient called requesting to speak with Ambrose Pancoast regarding Scooter, please call [phone].

## 2015-11-29 ENCOUNTER — Telehealth: Payer: Self-pay | Admitting: *Deleted

## 2015-11-29 NOTE — Telephone Encounter (Signed)
Spoke with patient and informed her this RN received form from [employer] with specific criteria for her power scooter device order. Advised her she needs an appointment for evaluation for power scooter ONLY, no other issues. She stated she can come any day, any time. Advised will schedule her asap and call her back with information, as she was in car with family members. She verbalized understanding, appreciation. Called patient back to inform her she is scheduled for follow up tomorrow at 1 pm, advised she arrive 15 min early. She stated she willl be alone and need assistance walking in. Advised she call phone staff and request this RN be skyped when she arrives. She verbalized understanding, appreciation.

## 2015-11-30 ENCOUNTER — Ambulatory Visit (INDEPENDENT_AMBULATORY_CARE_PROVIDER_SITE_OTHER): Payer: Medicare Other | Admitting: Diagnostic Neuroimaging

## 2015-11-30 ENCOUNTER — Encounter: Payer: Self-pay | Admitting: Diagnostic Neuroimaging

## 2015-11-30 VITALS — BP 104/67 | HR 88 | Wt 143.0 lb

## 2015-11-30 DIAGNOSIS — R269 Unspecified abnormalities of gait and mobility: Secondary | ICD-10-CM

## 2015-11-30 DIAGNOSIS — E559 Vitamin D deficiency, unspecified: Secondary | ICD-10-CM | POA: Diagnosis not present

## 2015-11-30 DIAGNOSIS — G35 Multiple sclerosis: Secondary | ICD-10-CM

## 2015-11-30 NOTE — Progress Notes (Signed)
PATIENT: Jeanette Estrada DOB: [birth year]  REASON FOR VISIT: routine follow up for MS (power scooter evaluation) HISTORY FROM: patient  Chief Complaint  Patient presents with  . Multiple Sclerosis    rm 6, evaluation for power scooter only    HISTORY OF PRESENT ILLNESS:  UPDATE 11/30/15: FACE-FACE EVALUATION: Patient presents for power scooter evaluation. Patient needs help to perform ADLs due to her poor mobility issues.   UPDATE 07/10/15 (VRP): Since last visit overall stable to slightly improved in functioning.Tolerating tecfidera. No new sxs.   UPDATE 07/14/14 (VRP): Since last visit, sxs are stable. She is using power scooter for mobility. Tolerating tecfidera. No new issues.  UPDATE 11/17/13 (LL): Since last visit, patient has been doing well. She is tolerating Tecfidera well without significant stomach upset.  No new neurologic symptoms. She received her power wheelchair which stays in the house and she has a motorized scooter to go away from home. She is able to walk short distances but needs something to hold on to.  UPDATE 05/12/13 (VRP): Patient presents for face-to-face evaluation of electric power wheelchair. Patient is accompanied by husband as well as representative from [facility]. Otherwise she's doing well. She's tolerating tecfidera medication. No new neurologic symptoms.  UPDATE 10/14/12: Since last visit, had MRI brain which showed progression of MS lesions. Symptoms slightly progressed (more swallow difficulty and double vision). Tolerating betaseron with some flu-like sxs.   UPDATE 04/01/12: Since last visit, has started on betaseron. Tolerating without HA. Some burning at injection sites. No new MS symptoms. Using rolling walker. Asking about ampyra.  UPDATE 12/31/11:  Has had 2 falls since last visit without injury.  has occasional numbness and difficulty with swallowing uses rolling walker. Vision is about the same.  Continues to have urine incontinence.  She  was taking Oxybutinin in the hospital but has not taken any since June 2013.  She has not been evaluated by Urology.  She is getting an Mining engineer wheelchair later this month.  She uses her rolling walker at all times.  She did not have a JCV antibody lab done.    PRIOR HPI (10/31/11): 40 year old left-handed female here for evaluation of multiple sclerosis. In 1999 patient had first episode of "eyes crossed" which was noted on a routine ophthalmology evaluation. She was then referred for prism glasses. However when she was using these glasses, her vision seemed to be worse and therefore she stopped using the glasses. Around the same time she was beginning to develop balance and gait difficulty, increasing car sickness. She had a MRI scan at that time which showed "lesions". For some reason this was not followed up. Over the years, she continued to have progression of balance and gait difficulty, slurred speech, falling down, localized weakness of her face, arm or leg. These episodes would tend to last one to 2 weeks a time and occur up to 5 times per year. She continued to have emergency room visits, CT scans without a specific diagnosis. In 2010, patient had significant increase in her falls and gait difficulty. From this point forward she needed to hold onto people, walls, furniture or railing for balance. In 2012 patient was at home, had an episode of decreased responsiveness, whole body tremors and shaking, possible seizure; but no mention in hospital records. In May 2013, patient had continued progression of symptoms, whole body tremors and shaking, and presented to the emergency room. She was evaluated with MRI of the brain and cervical spine. Extensive T2  hyperintense lesions were noted throughout the brain and spinal cord and presumptive diagnosis of multiple sclerosis was made. She was treated with IV Solu-Medrol for 5 days, and then transferred to inpatient rehabilitation for recovery. Since that time  patient's symptoms have been fairly stable. She continues to struggle with significant urinary incontinence, gait difficulty, whole-body tremors when she stands.   REVIEW OF SYSTEMS: Full 14 system review of systems performed and negative except for tremors.   ALLERGIES: No Known Allergies  HOME MEDICATIONS: Outpatient Medications Prior to Visit  Medication Sig Dispense Refill  . Dimethyl Fumarate (TECFIDERA) 240 MG CPDR Take 1 capsule (240 mg total) by mouth 2 (two) times daily. 180 capsule 1  . ibuprofen (ADVIL,MOTRIN) 200 MG tablet Take 200 mg by mouth every 6 (six) hours as needed for pain.    . cholecalciferol (VITAMIN D) 1000 UNITS tablet Take 1,000 Units by mouth daily.     No facility-administered medications prior to visit.     PHYSICAL EXAM Vitals:   11/30/15 1307  BP: 104/67  Pulse: 88  Weight: 143 lb (64.9 kg)   Body mass index is 21.43 kg/m.  GENERAL EXAM: General: Patient is awake, alert and in no acute distress.  Well developed and groomed. Cardiovascular: No carotid artery bruits.  Heart is regular rate and rhythm with no murmurs.  Neurologic Exam  Mental Status: Awake, alert. Language is mildly dysfluent, and comprehension intact. Cranial Nerves: Pupils are equal and reactive to light.   DECR LIGHT SENS IN LEFT EYE.  Visual fields are full to confrontation.  Conjugate eye movements are full and symmetric.  NYSTAGMUS ON RIGHT, LEFT, UP AND DOWN GAZE. SACCADIC BREAKDOWN OF SMOOTH PURSUIT. OVERSHOOT ON SACCADES. DECR SENS IN LEFT FACE. Facial strength are symmetric.  Hearing is intact.  Palate elevated symmetrically and uvula is midline.  Shoulder shrug is symmetric.  Tongue is midline. MILD DYSARTHRIA. Motor: Normal bulk and tone.  RIGHT UPPER EXT 4; LUE 4-; RLE 4; LLE 3; INCREASED TONE IN BLE.  Sensory: DECREASED IN LUE AND LLE.  Coordination: SLOW FTN WITH ATAXIA IN BILATERAL UPPER EXT Reflexes: Deep tendon reflexes in the upper and lower extremity are BRISK  and symmetric. CROSSED ADDUCTORS. Gait and Station: SITTING IN [place]. NOT ABLE TO WALK WITHOUT ASSISTANCE.     DIAGNOSTICS/IMAGING  CBC Latest Ref Rng & Units 07/10/2015 07/14/2014 11/17/2013  WBC 3.4 - 10.8 x10E3/uL 7.9 6.1 6.4  Hemoglobin 11.1 - 15.9 g/dL - - 40.912.3  Hematocrit 81.134.0 - 46.6 % 37.9 36.9 37.2  Platelets 150 - 379 x10E3/uL 260 224 245   Lab Results  Component Value Date   WBC 7.9 07/10/2015   HGB 12.3 11/17/2013   HCT 37.9 07/10/2015   MCV 88 07/10/2015   PLT 260 07/10/2015   Lymphocytes Absolute  Date Value Ref Range Status  07/10/2015 1.6 0.7 - 3.1 x10E3/uL Final  07/14/2014 1.1 0.7 - 3.1 x10E3/uL Final  11/17/2013 1.5 0.7 - 3.1 x10E3/uL Final   CMP Latest Ref Rng & Units 07/10/2015 07/14/2014 11/17/2013  Glucose 65 - 99 mg/dL 76 74 71  BUN 6 - 20 mg/dL 8 8 7   Creatinine 0.57 - 1.00 mg/dL 9.140.58 7.820.74 9.560.63  Sodium 134 - 144 mmol/L 139 140 139  Potassium 3.5 - 5.2 mmol/L 4.5 4.3 4.2  Chloride 96 - 106 mmol/L 103 103 101  CO2 18 - 29 mmol/L 23 24 24   Calcium 8.7 - 10.2 mg/dL 8.8 8.8 9.1  Total Protein 6.0 - 8.5 g/dL 6.7  6.3 6.8  Total Bilirubin 0.0 - 1.2 mg/dL 0.4 0.3 0.3  Alkaline Phos 39 - 117 IU/L 73 66 72  AST 0 - 40 IU/L 12 10 11   ALT 0 - 32 IU/L 11 6 6    Vit D, 25-Hydroxy  Date Value Ref Range Status  07/10/2015 37.1 30.0 - 100.0 ng/mL Final    Comment:    Vitamin D deficiency has been defined by the Institute of Medicine and an Endocrine Society practice guideline as a level of serum 25-OH vitamin D less than 20 ng/mL (1,2). The Endocrine Society went on to further define vitamin D insufficiency as a level between 21 and 29 ng/mL (2). 1. IOM (Institute of Medicine). 2010. Dietary reference    intakes for calcium and D. [place]: The    [employer]. 2. Holick MF, Binkley Browning, Bischoff-Ferrari HA, et al.    Evaluation, treatment, and prevention of vitamin D    deficiency: an Endocrine Society clinical practice    guideline. JCEM. 2011  Jul; 96(7):1911-30.   07/14/2014 24.0 (L) 30.0 - 100.0 ng/mL Final    Comment:    Vitamin D deficiency has been defined by the Institute of Medicine and an Endocrine Society practice guideline as a level of serum 25-OH vitamin D less than 20 ng/mL (1,2). The Endocrine Society went on to further define vitamin D insufficiency as a level between 21 and 29 ng/mL (2). 1. IOM (Institute of Medicine). 2010. Dietary reference    intakes for calcium and D. [place]: The    [employer]. 2. Holick MF, Binkley Tignall, Bischoff-Ferrari HA, et al.    Evaluation, treatment, and prevention of vitamin D    deficiency: an Endocrine Society clinical practice    guideline. JCEM. 2011 Jul; 96(7):1911-30.   05/12/2013 40.9 30.0 - 100.0 ng/mL Final    Comment:    Vitamin D deficiency has been defined by the Institute of Medicine and an Endocrine Society practice guideline as a level of serum 25-OH vitamin D less than 20 ng/mL (1,2). The Endocrine Society went on to further define vitamin D insufficiency as a level between 21 and 29 ng/mL (2). 1. IOM (Institute of Medicine). 2010. Dietary reference    intakes for calcium and D. [place]: The    [employer]. 2. Holick MF, Binkley Donaldson, Bischoff-Ferrari HA, et al.    Evaluation, treatment, and prevention of vitamin D    deficiency: an Endocrine Society clinical practice    guideline. JCEM. 2011 Jul; 96(7):1911-30.   08/12/11 MRI brain - extensive supratentorial and infratentorial chronic demyelinating plaques; single right inferior frontal enhancing lesion; numerous T1 black holes  08/12/11 MRI cervical spine - multiple spinal cord chronic demyelinating plaques  11/20/11 ANA, ACA, ACE, CBC with diff, CMP, HIV, HTLV (I, II), Hepatitis panel, NMO were all normal  07/06/12 MRI brain - extensive abnormal white matter changes consistent with advanced multiple sclerosis. No enhancing lesions are noted. Presence of T1 black holes and  atrophy of corpus callosum indicates chronic disease. Overall worsening of white matter disease compared with MRI scan dated 08/12/2011. At least one new lesion in left corona radiata.  07/23/15 MRI brain (with and without) [I reviewed images myself and agree with interpretation. -VRP]  1.    Multiple T2/FLAIR lesions in the cerebellum, brainstem, upper cervical spinal cord and cerebral hemispheres in a pattern and configuration consistent with severe chronic demyelinating plaques associated with multiple sclerosis. None of the foci enhances or appear to  be acute.     2.   Compared to the MRI dated 07/01/2012, there is no definite change.   ASSESSMENT/PLAN 40 y.o. female with multiple sclerosis. History, exam, MRI findings are all consistent with diagnosis of multiple sclerosis.  On betaseron since Nov 2013, now on tecfidera since August 2014 due to symptom and MRI progression on betaseron. Now stable on tecfidera.   FACE-FACE EVALUATION FOR POWER SCOOTER: - Patient has mobility issues due to multiple sclerosis. Patient mobility limitation affects cooking, cleaning, laundry. Not able to stand and balance to perform ADLs. Cane or walker not sufficient to support balance. Does not have sufficient upper ext coordination and strength to propel manual wheelchair. Patient able to safetly transfer to and from power scooter, able to control tiller steering, and able to maintain posture. Power scooter is preferred due to lighter weight and portability. Patient able mentally to use power scooter. Power scooter will improve ADLs at home.    Dx:  Multiple sclerosis (HCC)  Gait difficulty  Vitamin D deficiency    PLAN: - recommend 3 wheel compact power scooter for use in the home - forms filled out  Return in about 4 months (around 03/31/2016).     Suanne Marker, MD 11/30/2015, 1:26 PM Certified in Neurology, Neurophysiology and Neuroimaging  [facility] [address],  [address] [place], [place] [address] [phone]

## 2016-01-09 ENCOUNTER — Ambulatory Visit: Payer: Medicare Other | Admitting: Diagnostic Neuroimaging

## 2016-02-13 ENCOUNTER — Telehealth: Payer: Self-pay | Admitting: *Deleted

## 2016-02-13 NOTE — Telephone Encounter (Signed)
LVM requesting call back re: this [facility] patient has not refilled since Aug, [facility] has been unable to reach her. Requested call back for clarification.

## 2016-02-13 NOTE — Telephone Encounter (Signed)
Received call back from patient. She stated she is taking Tecfidera twice daily but currently has approximately 3 months supply. She stated she did not call for refill because she has medication. This RN stated she will call specialty pharmacy to inform them. Mariane Mastersalled Keith, pharmacist and advised him of above conversation. He stated he would push refill date out and continue providing patient with medication. He verbalized appreciation for call.

## 2016-03-22 ENCOUNTER — Telehealth: Payer: Self-pay | Admitting: Diagnostic Neuroimaging

## 2016-03-22 NOTE — Telephone Encounter (Signed)
Spoke with patient who stated she received a call from Medicare saying they were going to repossess her scooter due to Rx expiring. Patient has FU on 03/27/16. Advised her Dr Marjory Lies can resubmit new scooter order and include memo to the effect that this is a permanent need. She verbalized understanding, appreciation.

## 2016-03-22 NOTE — Telephone Encounter (Signed)
Pt called to advise medicare said RX for scooter has expired. Please call

## 2016-03-27 ENCOUNTER — Telehealth: Payer: Self-pay | Admitting: *Deleted

## 2016-03-27 ENCOUNTER — Ambulatory Visit: Payer: Medicare Other | Admitting: Diagnostic Neuroimaging

## 2016-03-27 NOTE — Telephone Encounter (Signed)
Returned patient's call who stated she was on the phone with Medicaid regarding her scooter. That call delayed her so she was unable to come in for her follow up this morning. She requested it be rescheduled; done on 04/16/16. Patient then stated that because Medicaid paid for her wheelchair, they will not pay for her scooter. She stated she would "just have to deal with that."  She verbalized appreciation for call back.

## 2016-03-27 NOTE — Telephone Encounter (Signed)
Pt returned RN's call °

## 2016-03-27 NOTE — Telephone Encounter (Signed)
LVM requesting call back re: no show today.

## 2016-03-28 ENCOUNTER — Encounter: Payer: Self-pay | Admitting: Diagnostic Neuroimaging

## 2016-04-16 ENCOUNTER — Ambulatory Visit: Payer: Self-pay | Admitting: Diagnostic Neuroimaging

## 2016-04-25 ENCOUNTER — Telehealth: Payer: Self-pay | Admitting: *Deleted

## 2016-04-25 NOTE — Telephone Encounter (Signed)
LVM requesting patient call back and reschedule her follow up; she cancelled her last follow up. Phone staff may schedule.  Refill for Tecfidera faxed in today for one year,  Visual merchandiser.

## 2016-05-20 ENCOUNTER — Telehealth: Payer: Self-pay | Admitting: *Deleted

## 2016-05-20 NOTE — Telephone Encounter (Signed)
Medicare prescription drug coverage for Tecfidera approved 03/16/2016 through 03/17/2017. Ref # [id number]

## 2016-09-02 DIAGNOSIS — Z0271 Encounter for disability determination: Secondary | ICD-10-CM

## 2016-09-11 ENCOUNTER — Encounter: Payer: Self-pay | Admitting: Diagnostic Neuroimaging

## 2016-09-11 ENCOUNTER — Ambulatory Visit: Payer: Medicare Other | Admitting: Diagnostic Neuroimaging

## 2016-09-11 ENCOUNTER — Ambulatory Visit (INDEPENDENT_AMBULATORY_CARE_PROVIDER_SITE_OTHER): Payer: Medicare Other | Admitting: Diagnostic Neuroimaging

## 2016-09-11 VITALS — BP 94/60 | HR 72 | Wt 116.4 lb

## 2016-09-11 DIAGNOSIS — R269 Unspecified abnormalities of gait and mobility: Secondary | ICD-10-CM | POA: Diagnosis not present

## 2016-09-11 DIAGNOSIS — E559 Vitamin D deficiency, unspecified: Secondary | ICD-10-CM

## 2016-09-11 DIAGNOSIS — G35 Multiple sclerosis: Secondary | ICD-10-CM | POA: Diagnosis not present

## 2016-09-11 NOTE — Progress Notes (Signed)
PATIENT: Jeanette Estrada DOB: Nov 20, 1975  REASON FOR VISIT: routine follow up for MS HISTORY FROM: patient  Chief Complaint  Patient presents with  . Multiple Sclerosis    rm 6, "nothing new; I feel physically better; tremors have eased a lot; losing weight but no appetite loss"  . Follow-up    4 month    HISTORY OF PRESENT ILLNESS:  UPDATE 09/11/16: Since last visit, doing well. Tolerating tecfidera. Now exercising on elliptical per day. No new neuro symptoms.   UPDATE 11/30/15: FACE-FACE EVALUATION: Patient presents for power scooter evaluation. Patient needs help to perform ADLs due to her poor mobility issues.   UPDATE 07/10/15 (VRP): Since last visit overall stable to slightly improved in functioning.Tolerating tecfidera. No new sxs.   UPDATE 07/14/14 (VRP): Since last visit, sxs are stable. She is using power scooter for mobility. Tolerating tecfidera. No new issues.  UPDATE 11/17/13 (LL): Since last visit, patient has been doing well. She is tolerating Tecfidera well without significant stomach upset.  No new neurologic symptoms. She received her power wheelchair which stays in the house and she has a motorized scooter to go away from home. She is able to walk short distances but needs something to hold on to.  UPDATE 05/12/13 (VRP): Patient presents for face-to-face evaluation of electric power wheelchair. Patient is accompanied by husband as well as representative from [facility]. Otherwise she's doing well. She's tolerating tecfidera medication. No new neurologic symptoms.  UPDATE 10/14/12: Since last visit, had MRI brain which showed progression of MS lesions. Symptoms slightly progressed (more swallow difficulty and double vision). Tolerating betaseron with some flu-like sxs.   UPDATE 04/01/12: Since last visit, has started on betaseron. Tolerating without HA. Some burning at injection sites. No new MS symptoms. Using rolling walker. Asking about ampyra.  UPDATE  12/31/11:  Has had 2 falls since last visit without injury.  has occasional numbness and difficulty with swallowing uses rolling walker. Vision is about the same.  Continues to have urine incontinence.  She was taking Oxybutinin in the hospital but has not taken any since June 2013.  She has not been evaluated by Urology.  She is getting an Mining engineer wheelchair later this month.  She uses her rolling walker at all times.  She did not have a JCV antibody lab done.    PRIOR HPI (10/31/11): 41 year old left-handed female here for evaluation of multiple sclerosis. In 1999 patient had first episode of "eyes crossed" which was noted on a routine ophthalmology evaluation. She was then referred for prism glasses. However when she was using these glasses, her vision seemed to be worse and therefore she stopped using the glasses. Around the same time she was beginning to develop balance and gait difficulty, increasing car sickness. She had a MRI scan at that time which showed "lesions". For some reason this was not followed up. Over the years, she continued to have progression of balance and gait difficulty, slurred speech, falling down, localized weakness of her face, arm or leg. These episodes would tend to last one to 2 weeks a time and occur up to 5 times per year. She continued to have emergency room visits, CT scans without a specific diagnosis. In 2010, patient had significant increase in her falls and gait difficulty. From this point forward she needed to hold onto people, walls, furniture or railing for balance. In 2012 patient was at home, had an episode of decreased responsiveness, whole body tremors and shaking, possible seizure;  but no mention in hospital records. In May 2013, patient had continued progression of symptoms, whole body tremors and shaking, and presented to the emergency room. She was evaluated with MRI of the brain and cervical spine. Extensive T2 hyperintense lesions were noted throughout the  brain and spinal cord and presumptive diagnosis of multiple sclerosis was made. She was treated with IV Solu-Medrol for 5 days, and then transferred to inpatient rehabilitation for recovery. Since that time patient's symptoms have been fairly stable. She continues to struggle with significant urinary incontinence, gait difficulty, whole-body tremors when she stands.   REVIEW OF SYSTEMS: Full 14 system review of systems performed and negative except for tremors.   ALLERGIES: No Known Allergies  HOME MEDICATIONS: Outpatient Medications Prior to Visit  Medication Sig Dispense Refill  . Dimethyl Fumarate (TECFIDERA) 240 MG CPDR Take 1 capsule (240 mg total) by mouth 2 (two) times daily. 180 capsule 1  . ibuprofen (ADVIL,MOTRIN) 200 MG tablet Take 200 mg by mouth every 6 (six) hours as needed for pain.     No facility-administered medications prior to visit.     PHYSICAL EXAM Vitals:   09/11/16 1322  BP: 94/60  Pulse: 72  Weight: 116 lb 6.4 oz (52.8 kg)   Wt Readings from Last 3 Encounters:  09/11/16 116 lb 6.4 oz (52.8 kg)  11/30/15 143 lb (64.9 kg)  07/10/15 135 lb (61.2 kg)   Body mass index is 17.44 kg/m.  GENERAL EXAM: General: Patient is awake, alert and in no acute distress.  Well developed and groomed. Cardiovascular: No carotid artery bruits.  Heart is regular rate and rhythm with no murmurs.  Neurologic Exam  Mental Status: Awake, alert. Language is mildly dysfluent, and comprehension intact. Cranial Nerves: Pupils are equal and reactive to light.   DECR LIGHT SENS IN LEFT EYE.  Visual fields are full to confrontation.  Conjugate eye movements are full and symmetric.  NYSTAGMUS IN ALL DIRECTIONS; SACCADIC BREAKDOWN OF SMOOTH PURSUIT. OVERSHOOT ON SACCADES. DECR SENS IN LEFT FACE. Facial strength are symmetric.  Hearing is intact.  Palate elevated symmetrically and uvula is midline.  Shoulder shrug is symmetric.  Tongue is midline. MODERATE DYSARTHRIA. Motor: Normal bulk  and tone.  BUE 5-; BLE 4+; INCREASED TONE IN BLE.  Sensory: DECREASED IN LUE AND LLE.  Coordination: SLOW FTN WITH ATAXIA IN BILATERAL UPPER EXT Reflexes: Deep tendon reflexes in the upper and lower extremity are BRISK and symmetric. CROSSED ADDUCTORS. Gait and Station: SITTING IN POWER SCOOTER. NOT ABLE TO WALK WITHOUT ASSISTANCE.     DIAGNOSTICS/IMAGING  CBC Latest Ref Rng & Units 07/10/2015 07/14/2014 11/17/2013  WBC 3.4 - 10.8 x10E3/uL 7.9 6.1 6.4  Hemoglobin 11.1 - 15.9 g/dL 98.1 19.1 47.8  Hematocrit 34.0 - 46.6 % 37.9 36.9 37.2  Platelets 150 - 379 x10E3/uL 260 224 245   Lab Results  Component Value Date   WBC 7.9 07/10/2015   HGB 12.2 07/10/2015   HCT 37.9 07/10/2015   MCV 88 07/10/2015   PLT 260 07/10/2015   Lymphocytes Absolute  Date Value Ref Range Status  07/10/2015 1.6 0.7 - 3.1 x10E3/uL Final  07/14/2014 1.1 0.7 - 3.1 x10E3/uL Final  11/17/2013 1.5 0.7 - 3.1 x10E3/uL Final   CMP Latest Ref Rng & Units 07/10/2015 07/14/2014 11/17/2013  Glucose 65 - 99 mg/dL 76 74 71  BUN 6 - 20 mg/dL 8 8 7   Creatinine 0.57 - 1.00 mg/dL 2.95 6.21 3.08  Sodium 134 - 144 mmol/L 139 140 139  Potassium 3.5 - 5.2 mmol/L 4.5 4.3 4.2  Chloride 96 - 106 mmol/L 103 103 101  CO2 18 - 29 mmol/L 23 24 24   Calcium 8.7 - 10.2 mg/dL 8.8 8.8 9.1  Total Protein 6.0 - 8.5 g/dL 6.7 6.3 6.8  Total Bilirubin 0.0 - 1.2 mg/dL 0.4 0.3 0.3  Alkaline Phos 39 - 117 IU/L 73 66 72  AST 0 - 40 IU/L 12 10 11   ALT 0 - 32 IU/L 11 6 6    Vit D, 25-Hydroxy  Date Value Ref Range Status  07/10/2015 37.1 30.0 - 100.0 ng/mL Final    Comment:    Vitamin D deficiency has been defined by the Institute of Medicine and an Endocrine Society practice guideline as a level of serum 25-OH vitamin D less than 20 ng/mL (1,2). The Endocrine Society went on to further define vitamin D insufficiency as a level between 21 and 29 ng/mL (2). 1. IOM (Institute of Medicine). 2010. Dietary reference    intakes for calcium and D.  [place]: The    [employer]. 2. Holick MF, Binkley [place], Bischoff-Ferrari HA, et al.    Evaluation, treatment, and prevention of vitamin D    deficiency: an Endocrine Society clinical practice    guideline. JCEM. 2011 Jul; 96(7):1911-30.   07/14/2014 24.0 (L) 30.0 - 100.0 ng/mL Final    Comment:    Vitamin D deficiency has been defined by the Institute of Medicine and an Endocrine Society practice guideline as a level of serum 25-OH vitamin D less than 20 ng/mL (1,2). The Endocrine Society went on to further define vitamin D insufficiency as a level between 21 and 29 ng/mL (2). 1. IOM (Institute of Medicine). 2010. Dietary reference    intakes for calcium and D. [place]: The    [employer]. 2. Holick MF, Binkley [place], Bischoff-Ferrari HA, et al.    Evaluation, treatment, and prevention of vitamin D    deficiency: an Endocrine Society clinical practice    guideline. JCEM. 2011 Jul; 96(7):1911-30.   05/12/2013 40.9 30.0 - 100.0 ng/mL Final    Comment:    Vitamin D deficiency has been defined by the Institute of Medicine and an Endocrine Society practice guideline as a level of serum 25-OH vitamin D less than 20 ng/mL (1,2). The Endocrine Society went on to further define vitamin D insufficiency as a level between 21 and 29 ng/mL (2). 1. IOM (Institute of Medicine). 2010. Dietary reference    intakes for calcium and D. [place]: The    [employer]. 2. Holick MF, Binkley [place], Bischoff-Ferrari HA, et al.    Evaluation, treatment, and prevention of vitamin D    deficiency: an Endocrine Society clinical practice    guideline. JCEM. 2011 Jul; 96(7):1911-30.   08/12/11 MRI brain - extensive supratentorial and infratentorial chronic demyelinating plaques; single right inferior frontal enhancing lesion; numerous T1 black holes  08/12/11 MRI cervical spine - multiple spinal cord chronic demyelinating plaques  11/20/11 ANA, ACA, ACE, CBC with  diff, CMP, HIV, HTLV (I, II), Hepatitis panel, NMO were all normal  07/06/12 MRI brain - extensive abnormal white matter changes consistent with advanced multiple sclerosis. No enhancing lesions are noted. Presence of T1 black holes and atrophy of corpus callosum indicates chronic disease. Overall worsening of white matter disease compared with MRI scan dated 08/12/2011. At least one new lesion in left corona radiata.  07/23/15 MRI brain (with and without) [I reviewed images myself and agree with interpretation. -  VRP]  1.    Multiple T2/FLAIR lesions in the cerebellum, brainstem, upper cervical spinal cord and cerebral hemispheres in a pattern and configuration consistent with severe chronic demyelinating plaques associated with multiple sclerosis. None of the foci enhances or appear to be acute.     2.   Compared to the MRI dated 07/01/2012, there is no definite change.   ASSESSMENT/PLAN 41 y.o. female with multiple sclerosis. History, exam, MRI findings are all consistent with diagnosis of multiple sclerosis.  On betaseron since Nov 2013, now on tecfidera since August 2014 due to symptom and MRI progression on betaseron. Now stable on tecfidera.    Dx:  Multiple sclerosis (HCC) - Plan: MR BRAIN W WO CONTRAST, CBC with Differential/Platelet, Comprehensive metabolic panel, VITAMIN D 25 Hydroxy (Vit-D Deficiency, Fractures)  Gait difficulty - Plan: MR BRAIN W WO CONTRAST, CBC with Differential/Platelet, Comprehensive metabolic panel, VITAMIN D 25 Hydroxy (Vit-D Deficiency, Fractures)  Vitamin D deficiency - Plan: MR BRAIN W WO CONTRAST, CBC with Differential/Platelet, Comprehensive metabolic panel, VITAMIN D 25 Hydroxy (Vit-D Deficiency, Fractures)    PLAN: - check MRI brain; if progression, then may consider changing tecfidera to ocrevus - check labs - start multi-vitamin and vitamin D supplement  Orders Placed This Encounter  Procedures  . MR BRAIN W WO CONTRAST  . CBC with  Differential/Platelet  . Comprehensive metabolic panel  . VITAMIN D 25 Hydroxy (Vit-D Deficiency, Fractures)   Return in about 9 months (around 06/11/2017).     Suanne Marker, MD 09/11/2016, 2:06 PM Certified in Neurology, Neurophysiology and Neuroimaging  [facility] [address] [place], [place] [address] [phone]

## 2016-09-12 ENCOUNTER — Telehealth: Payer: Self-pay | Admitting: *Deleted

## 2016-09-12 LAB — CBC WITH DIFFERENTIAL/PLATELET
BASOS ABS: 0 10*3/uL (ref 0.0–0.2)
Basos: 1 %
EOS (ABSOLUTE): 0 10*3/uL (ref 0.0–0.4)
Eos: 1 %
Hematocrit: 35.5 % (ref 34.0–46.6)
Hemoglobin: 11.3 g/dL (ref 11.1–15.9)
Immature Grans (Abs): 0 10*3/uL (ref 0.0–0.1)
Immature Granulocytes: 0 %
LYMPHS ABS: 1.4 10*3/uL (ref 0.7–3.1)
Lymphs: 23 %
MCH: 28.7 pg (ref 26.6–33.0)
MCHC: 31.8 g/dL (ref 31.5–35.7)
MCV: 90 fL (ref 79–97)
MONOS ABS: 0.3 10*3/uL (ref 0.1–0.9)
Monocytes: 4 %
NEUTROS ABS: 4.6 10*3/uL (ref 1.4–7.0)
Neutrophils: 71 %
PLATELETS: 267 10*3/uL (ref 150–379)
RBC: 3.94 x10E6/uL (ref 3.77–5.28)
RDW: 14.1 % (ref 12.3–15.4)
WBC: 6.4 10*3/uL (ref 3.4–10.8)

## 2016-09-12 LAB — COMPREHENSIVE METABOLIC PANEL
ALK PHOS: 70 IU/L (ref 39–117)
ALT: 15 IU/L (ref 0–32)
AST: 13 IU/L (ref 0–40)
Albumin/Globulin Ratio: 2 (ref 1.2–2.2)
Albumin: 4.2 g/dL (ref 3.5–5.5)
BILIRUBIN TOTAL: 0.3 mg/dL (ref 0.0–1.2)
BUN/Creatinine Ratio: 13 (ref 9–23)
BUN: 10 mg/dL (ref 6–24)
CHLORIDE: 104 mmol/L (ref 96–106)
CO2: 23 mmol/L (ref 20–29)
Calcium: 9.1 mg/dL (ref 8.7–10.2)
Creatinine, Ser: 0.75 mg/dL (ref 0.57–1.00)
GFR calc Af Amer: 115 mL/min/{1.73_m2} (ref >59)
GFR calc non Af Amer: 100 mL/min/{1.73_m2} (ref >59)
GLUCOSE: 73 mg/dL (ref 65–99)
Globulin, Total: 2.1 g/dL (ref 1.5–4.5)
Potassium: 4.7 mmol/L (ref 3.5–5.2)
Sodium: 141 mmol/L (ref 134–144)
Total Protein: 6.3 g/dL (ref 6.0–8.5)

## 2016-09-12 LAB — VITAMIN D 25 HYDROXY (VIT D DEFICIENCY, FRACTURES): Vit D, 25-Hydroxy: 27 ng/mL — ABNORMAL LOW (ref 30.0–100.0)

## 2016-09-12 NOTE — Telephone Encounter (Signed)
Spoke with patient and informed her that her lab results are unremarkable except for a slightly low Vitamin D. Advised she should start taking a vitamin D3 1000 units daily supplement. She verbalized understanding, appreciation and stated she would get Vit D today.

## 2016-09-23 ENCOUNTER — Telehealth: Payer: Self-pay | Admitting: Diagnostic Neuroimaging

## 2016-09-23 MED ORDER — BACLOFEN 10 MG PO TABS: 10.0000 mg | ORAL_TABLET | Freq: Two times a day (BID) | ORAL | 3 refills | Status: DC | PRN

## 2016-09-23 NOTE — Telephone Encounter (Signed)
Try baclofen 10mg  BID as needed for spasms. rx sent in. -VRP

## 2016-09-23 NOTE — Telephone Encounter (Signed)
Spoke to pt and she states for the last 1 1/2 wks she has had bilateral feet, leg and arms cramps (spasms).  Is painful at first when moves/ gets up then eases off.  These last 5-6 mins.  She has not been on medication for spasms.  MRI scheduled [place] 10-21-16 (fist availa).

## 2016-09-23 NOTE — Telephone Encounter (Signed)
Spoke to pt and let her know that baclofen called into her local pharmacy 10mg  po bid prn.  She verbalized understanding.

## 2016-09-23 NOTE — Telephone Encounter (Signed)
Patient called and requested to speak with the nurse regarding some pain and numbness she has been experiencing in her limbs. Please call and advise.

## 2016-09-30 ENCOUNTER — Other Ambulatory Visit: Payer: Medicare Other

## 2016-10-21 ENCOUNTER — Other Ambulatory Visit: Payer: Medicare Other

## 2017-05-07 ENCOUNTER — Telehealth: Payer: Self-pay | Admitting: *Deleted

## 2017-05-07 MED ORDER — DIMETHYL FUMARATE 240 MG PO CPDR: 240.0000 mg | DELAYED_RELEASE_CAPSULE | Freq: Two times a day (BID) | ORAL | 1 refills | Status: DC

## 2017-05-07 NOTE — Telephone Encounter (Signed)
Received fax request from [facility] specialty pharmacy to refill Tecfidera. Patient has FU in April; tecfidera refilled through e scribe.

## 2017-05-26 ENCOUNTER — Telehealth: Payer: Self-pay | Admitting: Diagnostic Neuroimaging

## 2017-05-26 NOTE — Telephone Encounter (Signed)
Pt is not able to get incontinence supplies, she is being told provider will need to send RX or letter. Pt does not know where to send it. Please call to advise

## 2017-05-26 NOTE — Telephone Encounter (Signed)
Spoke to pt.  She has been getting incontinence supplies for several yrs.  She has MS.  She stated that she was told  that she was no longer eligible to receive.  I relayed that I did not have any information on this.  I do know that thru medicaid we received forms for pts on getting certain supplies.  She will call DSS and find out what is needed.  If we can help let us know.

## 2017-06-10 ENCOUNTER — Telehealth: Payer: Self-pay | Admitting: Diagnostic Neuroimaging

## 2017-06-10 NOTE — Telephone Encounter (Signed)
Nikki with [employer] called stating the pts Tecfidera is need a reauthorization(pt due for a refill on 3/28) please fax to [phone]. Any questions Lowella Bandy can be reached at [phone]

## 2017-06-10 NOTE — Telephone Encounter (Signed)
Signed. -VRP 

## 2017-06-10 NOTE — Telephone Encounter (Signed)
Form completed - needs signature

## 2017-06-11 ENCOUNTER — Ambulatory Visit: Payer: Medicare Other | Admitting: Diagnostic Neuroimaging

## 2017-06-16 NOTE — Telephone Encounter (Addendum)
Received fax, re: Tecfidera approved for coverage thru [employer], effective 03/16/17 through 03/17/18.

## 2017-06-17 ENCOUNTER — Ambulatory Visit (INDEPENDENT_AMBULATORY_CARE_PROVIDER_SITE_OTHER): Payer: Medicare Other | Admitting: Diagnostic Neuroimaging

## 2017-06-17 ENCOUNTER — Telehealth: Payer: Self-pay | Admitting: Diagnostic Neuroimaging

## 2017-06-17 ENCOUNTER — Encounter: Payer: Self-pay | Admitting: Diagnostic Neuroimaging

## 2017-06-17 VITALS — BP 94/62 | HR 64 | Ht 68.75 in | Wt 117.4 lb

## 2017-06-17 DIAGNOSIS — R269 Unspecified abnormalities of gait and mobility: Secondary | ICD-10-CM | POA: Diagnosis not present

## 2017-06-17 DIAGNOSIS — E559 Vitamin D deficiency, unspecified: Secondary | ICD-10-CM

## 2017-06-17 DIAGNOSIS — G35 Multiple sclerosis: Secondary | ICD-10-CM

## 2017-06-17 DIAGNOSIS — M62838 Other muscle spasm: Secondary | ICD-10-CM | POA: Diagnosis not present

## 2017-06-17 MED ORDER — BACLOFEN 10 MG PO TABS
10.0000 mg | ORAL_TABLET | Freq: Two times a day (BID) | ORAL | 6 refills | Status: DC | PRN
Start: 2017-06-17 — End: 2018-02-02

## 2017-06-17 NOTE — Patient Instructions (Signed)
MULTIPLE SCLEROSIS  - check MRI brain; if progression, then could change tecfidera to ocrevus  - continue multi-vitamin and vitamin D supplement  - check labs (CBC, CMP)  - baclofen as needed for spasms

## 2017-06-17 NOTE — Telephone Encounter (Signed)
Medicare/medicaid order sent to [facility] no auth they will contact the pt to schedule.

## 2017-06-17 NOTE — Progress Notes (Signed)
PATIENT: Jeanette Estrada DOB: Feb 04, 1976  REASON FOR VISIT: routine follow up for MS HISTORY FROM: patient  Chief Complaint  Patient presents with  . Multiple Sclerosis    rm 6, "no new issues"  . Follow-up    HISTORY OF PRESENT ILLNESS:  UPDATE (06/17/17, VRP): Since last visit, doing well. Tolerating tecfidera. No alleviating or aggravating factors. Baclofen helping with spasms. Patient drove herself here. She drives her kids to school everyday.   UPDATE 09/11/16: Since last visit, doing well. Tolerating tecfidera. Now exercising on elliptical per day. No new neuro symptoms.   UPDATE 11/30/15: FACE-FACE EVALUATION: Patient presents for power scooter evaluation. Patient needs help to perform ADLs due to her poor mobility issues.   UPDATE 07/10/15 (VRP): Since last visit overall stable to slightly improved in functioning.Tolerating tecfidera. No new sxs.   UPDATE 07/14/14 (VRP): Since last visit, sxs are stable. She is using power scooter for mobility. Tolerating tecfidera. No new issues.  UPDATE 11/17/13 (LL): Since last visit, patient has been doing well. She is tolerating Tecfidera well without significant stomach upset.  No new neurologic symptoms. She received her power wheelchair which stays in the house and she has a motorized scooter to go away from home. She is able to walk short distances but needs something to hold on to.  UPDATE 05/12/13 (VRP): Patient presents for face-to-face evaluation of electric power wheelchair. Patient is accompanied by husband as well as representative from [facility]. Otherwise she's doing well. She's tolerating tecfidera medication. No new neurologic symptoms.  UPDATE 10/14/12: Since last visit, had MRI brain which showed progression of MS lesions. Symptoms slightly progressed (more swallow difficulty and double vision). Tolerating betaseron with some flu-like sxs.   UPDATE 04/01/12: Since last visit, has started on betaseron. Tolerating  without HA. Some burning at injection sites. No new MS symptoms. Using rolling walker. Asking about ampyra.  UPDATE 12/31/11:  Has had 2 falls since last visit without injury.  has occasional numbness and difficulty with swallowing uses rolling walker. Vision is about the same.  Continues to have urine incontinence.  She was taking Oxybutinin in the hospital but has not taken any since June 2013.  She has not been evaluated by Urology.  She is getting an Mining engineer wheelchair later this month.  She uses her rolling walker at all times.  She did not have a JCV antibody lab done.    PRIOR HPI (10/31/11): 42 year old left-handed female here for evaluation of multiple sclerosis. In 1999 patient had first episode of "eyes crossed" which was noted on a routine ophthalmology evaluation. She was then referred for prism glasses. However when she was using these glasses, her vision seemed to be worse and therefore she stopped using the glasses. Around the same time she was beginning to develop balance and gait difficulty, increasing car sickness. She had a MRI scan at that time which showed "lesions". For some reason this was not followed up. Over the years, she continued to have progression of balance and gait difficulty, slurred speech, falling down, localized weakness of her face, arm or leg. These episodes would tend to last one to 2 weeks a time and occur up to 5 times per year. She continued to have emergency room visits, CT scans without a specific diagnosis. In 2010, patient had significant increase in her falls and gait difficulty. From this point forward she needed to hold onto people, walls, furniture or railing for balance. In 2012 patient was at home,  had an episode of decreased responsiveness, whole body tremors and shaking, possible seizure; but no mention in hospital records. In May 2013, patient had continued progression of symptoms, whole body tremors and shaking, and presented to the emergency room. She  was evaluated with MRI of the brain and cervical spine. Extensive T2 hyperintense lesions were noted throughout the brain and spinal cord and presumptive diagnosis of multiple sclerosis was made. She was treated with IV Solu-Medrol for 5 days, and then transferred to inpatient rehabilitation for recovery. Since that time patient's symptoms have been fairly stable. She continues to struggle with significant urinary incontinence, gait difficulty, whole-body tremors when she stands.   REVIEW OF SYSTEMS: Full 14 system review of systems performed and negative except: for tremors.   ALLERGIES: No Known Allergies  HOME MEDICATIONS: Outpatient Medications Prior to Visit  Medication Sig Dispense Refill  . cholecalciferol (VITAMIN D) 1000 units tablet Take 1,000 Units by mouth daily.    . Dimethyl Fumarate (TECFIDERA) 240 MG CPDR Take 1 capsule (240 mg total) by mouth 2 (two) times daily. 180 capsule 1  . ibuprofen (ADVIL,MOTRIN) 200 MG tablet Take 200 mg by mouth every 6 (six) hours as needed for pain.    . baclofen (LIORESAL) 10 MG tablet Take 1 tablet (10 mg total) by mouth 2 (two) times daily as needed for muscle spasms. (Patient not taking: Reported on 06/17/2017) 30 each 3   No facility-administered medications prior to visit.     PHYSICAL EXAM Vitals:   06/17/17 0907  BP: 94/62  Pulse: 64  Weight: 117 lb 6.4 oz (53.3 kg)  Height: 5' 8.75" (1.746 m)   Wt Readings from Last 3 Encounters:  06/17/17 117 lb 6.4 oz (53.3 kg)  09/11/16 116 lb 6.4 oz (52.8 kg)  11/30/15 143 lb (64.9 kg)   Body mass index is 17.46 kg/m.  GENERAL EXAM: General: Patient is awake, alert and in no acute distress.  Well developed and groomed. Cardiovascular: No carotid artery bruits.  Heart is regular rate and rhythm with no murmurs.  Neurologic Exam  Mental Status: Awake, alert. Language is mildly dysfluent, and comprehension intact. Cranial Nerves: Pupils are equal and reactive to light.   DECR LIGHT SENS  IN LEFT EYE.  Visual fields are full to confrontation.  Conjugate eye movements are full and symmetric.  NYSTAGMUS IN ALL DIRECTIONS; SACCADIC BREAKDOWN OF SMOOTH PURSUIT. OVERSHOOT ON SACCADES. DECR SENS IN LEFT FACE. Facial strength are symmetric.  Hearing is intact.  Palate elevated symmetrically and uvula is midline.  Shoulder shrug is symmetric.  Tongue is midline. MILD DYSARTHRIA. Motor: Normal bulk and tone.  BUE 5-; BLE 3+; INCREASED TONE IN BLE.  Sensory: DECREASED IN LUE AND LLE.  Coordination: SLOW FTN WITH ATAXIA IN BILATERAL UPPER EXT Reflexes: Deep tendon reflexes in the upper and lower extremity are BRISK and symmetric. CROSSED ADDUCTORS. Gait and Station: SITTING IN [place]; CANNOT WALK UNASSISTED    DIAGNOSTICS/IMAGING  CBC Latest Ref Rng & Units 09/11/2016 07/10/2015 07/14/2014  WBC 3.4 - 10.8 x10E3/uL 6.4 7.9 6.1  Hemoglobin 11.1 - 15.9 g/dL 60.4 54.0 98.1  Hematocrit 34.0 - 46.6 % 35.5 37.9 36.9  Platelets 150 - 379 x10E3/uL 267 260 224   Lab Results  Component Value Date   WBC 6.4 09/11/2016   HGB 11.3 09/11/2016   HCT 35.5 09/11/2016   MCV 90 09/11/2016   PLT 267 09/11/2016   Lymphocytes Absolute  Date Value Ref Range Status  09/11/2016 1.4 0.7 - 3.1  x10E3/uL Final  07/10/2015 1.6 0.7 - 3.1 x10E3/uL Final  07/14/2014 1.1 0.7 - 3.1 x10E3/uL Final   CMP Latest Ref Rng & Units 09/11/2016 07/10/2015 07/14/2014  Glucose 65 - 99 mg/dL 73 76 74  BUN 6 - 24 mg/dL 10 8 8   Creatinine 0.57 - 1.00 mg/dL 4.09 8.11 9.14  Sodium 134 - 144 mmol/L 141 139 140  Potassium 3.5 - 5.2 mmol/L 4.7 4.5 4.3  Chloride 96 - 106 mmol/L 104 103 103  CO2 20 - 29 mmol/L 23 23 24   Calcium 8.7 - 10.2 mg/dL 9.1 8.8 8.8  Total Protein 6.0 - 8.5 g/dL 6.3 6.7 6.3  Total Bilirubin 0.0 - 1.2 mg/dL 0.3 0.4 0.3  Alkaline Phos 39 - 117 IU/L 70 73 66  AST 0 - 40 IU/L 13 12 10   ALT 0 - 32 IU/L 15 11 6    Vit D, 25-Hydroxy  Date Value Ref Range Status  09/11/2016 27.0 (L) 30.0 - 100.0 ng/mL Final     Comment:    Vitamin D deficiency has been defined by the Institute of Medicine and an Endocrine Society practice guideline as a level of serum 25-OH vitamin D less than 20 ng/mL (1,2). The Endocrine Society went on to further define vitamin D insufficiency as a level between 21 and 29 ng/mL (2). 1. IOM (Institute of Medicine). 2010. Dietary reference    intakes for calcium and D. [place]: The    [employer]. 2. Holick MF, Binkley [place], Bischoff-Ferrari HA, et al.    Evaluation, treatment, and prevention of vitamin D    deficiency: an Endocrine Society clinical practice    guideline. JCEM. 2011 Jul; 96(7):1911-30.   07/10/2015 37.1 30.0 - 100.0 ng/mL Final    Comment:    Vitamin D deficiency has been defined by the Institute of Medicine and an Endocrine Society practice guideline as a level of serum 25-OH vitamin D less than 20 ng/mL (1,2). The Endocrine Society went on to further define vitamin D insufficiency as a level between 21 and 29 ng/mL (2). 1. IOM (Institute of Medicine). 2010. Dietary reference    intakes for calcium and D. [place]: The    [employer]. 2. Holick MF, Binkley [place], Bischoff-Ferrari HA, et al.    Evaluation, treatment, and prevention of vitamin D    deficiency: an Endocrine Society clinical practice    guideline. JCEM. 2011 Jul; 96(7):1911-30.   07/14/2014 24.0 (L) 30.0 - 100.0 ng/mL Final    Comment:    Vitamin D deficiency has been defined by the Institute of Medicine and an Endocrine Society practice guideline as a level of serum 25-OH vitamin D less than 20 ng/mL (1,2). The Endocrine Society went on to further define vitamin D insufficiency as a level between 21 and 29 ng/mL (2). 1. IOM (Institute of Medicine). 2010. Dietary reference    intakes for calcium and D. [place]: The    [employer]. 2. Holick MF, Binkley [place], Bischoff-Ferrari HA, et al.    Evaluation, treatment, and prevention of  vitamin D    deficiency: an Endocrine Society clinical practice    guideline. JCEM. 2011 Jul; 96(7):1911-30.    08/12/11 MRI brain - extensive supratentorial and infratentorial chronic demyelinating plaques; single right inferior frontal enhancing lesion; numerous T1 black holes  08/12/11 MRI cervical spine - multiple spinal cord chronic demyelinating plaques  11/20/11 ANA, ACA, ACE, CBC with diff, CMP, HIV, HTLV (I, II), Hepatitis panel, NMO were all normal  07/06/12 MRI brain - extensive abnormal white matter changes consistent with advanced multiple sclerosis. No enhancing lesions are noted. Presence of T1 black holes and atrophy of corpus callosum indicates chronic disease. Overall worsening of white matter disease compared with MRI scan dated 08/12/2011. At least one new lesion in left corona radiata.  07/23/15 MRI brain (with and without) [I reviewed images myself and agree with interpretation. -VRP]  1.    Multiple T2/FLAIR lesions in the cerebellum, brainstem, upper cervical spinal cord and cerebral hemispheres in a pattern and configuration consistent with severe chronic demyelinating plaques associated with multiple sclerosis. None of the foci enhances or appear to be acute.     2.   Compared to the MRI dated 07/01/2012, there is no definite change.   ASSESSMENT/PLAN 42 y.o. female with multiple sclerosis. History, exam, MRI findings are all consistent with diagnosis of multiple sclerosis. On betaseron since Nov 2013, now on tecfidera since August 2014 due to symptom and MRI progression on betaseron. Now stable on tecfidera.    Dx:  Multiple sclerosis (HCC)  Gait difficulty  Vitamin D deficiency  Muscle spasm    PLAN:  MULTIPLE SCLEROSIS - check MRI brain; if progression, then could change tecfidera to ocrevus - continue multi-vitamin and vitamin D supplement - check labs (CBC, CMP) - baclofen as needed for spasms  Orders Placed This Encounter  Procedures  . MR BRAIN W WO  CONTRAST  . CBC with Differential/Platelet  . Comprehensive metabolic panel  . VITAMIN D 25 Hydroxy (Vit-D Deficiency, Fractures)   Meds ordered this encounter  Medications  . baclofen (LIORESAL) 10 MG tablet    Sig: Take 1 tablet (10 mg total) by mouth 2 (two) times daily as needed for muscle spasms.    Dispense:  30 each    Refill:  6   Return in about 1 year (around 06/18/2018).     Suanne Marker, MD 06/17/2017, 9:31 AM Certified in Neurology, Neurophysiology and Neuroimaging  [facility] [address], [address] [place], [place] [address] [phone]

## 2017-06-18 ENCOUNTER — Telehealth: Payer: Self-pay | Admitting: *Deleted

## 2017-06-18 LAB — CBC WITH DIFFERENTIAL/PLATELET
BASOS: 1 %
Basophils Absolute: 0 10*3/uL (ref 0.0–0.2)
EOS (ABSOLUTE): 0 10*3/uL (ref 0.0–0.4)
EOS: 0 %
HEMATOCRIT: 34.2 % (ref 34.0–46.6)
HEMOGLOBIN: 11.6 g/dL (ref 11.1–15.9)
IMMATURE GRANS (ABS): 0 10*3/uL (ref 0.0–0.1)
IMMATURE GRANULOCYTES: 0 %
LYMPHS: 24 %
Lymphocytes Absolute: 1.2 10*3/uL (ref 0.7–3.1)
MCH: 29.7 pg (ref 26.6–33.0)
MCHC: 33.9 g/dL (ref 31.5–35.7)
MCV: 88 fL (ref 79–97)
Monocytes Absolute: 0.4 10*3/uL (ref 0.1–0.9)
Monocytes: 8 %
NEUTROS PCT: 67 %
Neutrophils Absolute: 3.3 10*3/uL (ref 1.4–7.0)
PLATELETS: 244 10*3/uL (ref 150–379)
RBC: 3.91 x10E6/uL (ref 3.77–5.28)
RDW: 15.2 % (ref 12.3–15.4)
WBC: 4.9 10*3/uL (ref 3.4–10.8)

## 2017-06-18 LAB — COMPREHENSIVE METABOLIC PANEL
A/G RATIO: 1.7 (ref 1.2–2.2)
ALBUMIN: 4 g/dL (ref 3.5–5.5)
ALT: 7 IU/L (ref 0–32)
AST: 12 IU/L (ref 0–40)
Alkaline Phosphatase: 69 IU/L (ref 39–117)
BILIRUBIN TOTAL: 0.3 mg/dL (ref 0.0–1.2)
BUN / CREAT RATIO: 19 (ref 9–23)
BUN: 10 mg/dL (ref 6–24)
CALCIUM: 8.8 mg/dL (ref 8.7–10.2)
CHLORIDE: 105 mmol/L (ref 96–106)
CO2: 21 mmol/L (ref 20–29)
Creatinine, Ser: 0.54 mg/dL — ABNORMAL LOW (ref 0.57–1.00)
GFR, EST AFRICAN AMERICAN: 136 mL/min/{1.73_m2} (ref >59)
GFR, EST NON AFRICAN AMERICAN: 118 mL/min/{1.73_m2} (ref >59)
GLOBULIN, TOTAL: 2.3 g/dL (ref 1.5–4.5)
Glucose: 57 mg/dL — ABNORMAL LOW (ref 65–99)
POTASSIUM: 4.2 mmol/L (ref 3.5–5.2)
Sodium: 140 mmol/L (ref 134–144)
TOTAL PROTEIN: 6.3 g/dL (ref 6.0–8.5)

## 2017-06-18 LAB — VITAMIN D 25 HYDROXY (VIT D DEFICIENCY, FRACTURES): Vit D, 25-Hydroxy: 39.9 ng/mL (ref 30.0–100.0)

## 2017-06-18 NOTE — Telephone Encounter (Signed)
LVM informing patient her lab results are unremarkable. Left number for any questions. 

## 2017-06-25 ENCOUNTER — Ambulatory Visit
Admission: RE | Admit: 2017-06-25 | Discharge: 2017-06-25 | Disposition: A | Discharge status: Home / Self Care | Payer: Medicare Other | Source: Ambulatory Visit | Attending: Diagnostic Neuroimaging | Admitting: Diagnostic Neuroimaging

## 2017-06-25 DIAGNOSIS — G35 Multiple sclerosis: Secondary | ICD-10-CM | POA: Diagnosis not present

## 2017-06-25 DIAGNOSIS — E559 Vitamin D deficiency, unspecified: Secondary | ICD-10-CM

## 2017-06-25 DIAGNOSIS — R269 Unspecified abnormalities of gait and mobility: Secondary | ICD-10-CM

## 2017-06-25 MED ORDER — GADOBENATE DIMEGLUMINE 529 MG/ML IV SOLN
11.0000 mL | Freq: Once | INTRAVENOUS | Status: AC | PRN
  Administered 2017-06-25: 11 mL via INTRAVENOUS

## 2017-06-27 ENCOUNTER — Telehealth: Payer: Self-pay | Admitting: *Deleted

## 2017-06-27 NOTE — Telephone Encounter (Signed)
LVM informing patient her MRI brain result is stable. Advised her Dr Marjory Lies will continue her current plan. Advised her he stated she may continue tecfidera (because her scan and symptoms are stable) or switch to ocrevus if she wants to try a new medication. Spelled ocrevus so she can read about it, advised she may call back next week if she wants to switch. Left office number.

## 2017-07-15 ENCOUNTER — Telehealth: Payer: Self-pay | Admitting: Diagnostic Neuroimaging

## 2017-07-15 NOTE — Telephone Encounter (Signed)
Pt called stating that she will be faxing DMV paperwork for Dr. Marjory Lies to fill out. Pt aware Dr. Marjory Lies will be out of the office until Thursday 5/2 FYI

## 2017-07-15 NOTE — Telephone Encounter (Signed)
Noted  

## 2017-07-22 ENCOUNTER — Telehealth: Payer: Self-pay | Admitting: Diagnostic Neuroimaging

## 2017-07-22 NOTE — Telephone Encounter (Signed)
Patient requesting a call back and would not go into detail.  °

## 2017-07-23 NOTE — Telephone Encounter (Signed)
Attempted to call pt back. No answer.

## 2017-07-23 NOTE — Telephone Encounter (Signed)
Pt returned call. She stated that her question was already answered yesterday. She was advised that the cost of the DMV form completion will be $50. She stated that she is waiting on her next social check to be able to pay this. She is aware that money is required up front before forms are completed and processed. She verbalized appreciation.

## 2017-07-30 ENCOUNTER — Telehealth: Payer: Self-pay | Admitting: Diagnostic Neuroimaging

## 2017-07-30 NOTE — Telephone Encounter (Signed)
LVM to collect $50 fee for FMLA forms

## 2017-07-30 NOTE — Telephone Encounter (Signed)
Pt returned Katie's call. She is unable to pay until 08/18/17. She will call back then. FYI

## 2017-08-01 ENCOUNTER — Telehealth: Payer: Self-pay | Admitting: *Deleted

## 2017-08-01 NOTE — Telephone Encounter (Signed)
LMVM for pt that received from [facility] that they have made numerous attempts to contact re: refill.  I gave her the phone # to call [phone].  To return call to [place] if having problems.

## 2017-08-18 DIAGNOSIS — Z0289 Encounter for other administrative examinations: Secondary | ICD-10-CM

## 2017-09-15 ENCOUNTER — Telehealth: Payer: Self-pay | Admitting: *Deleted

## 2017-09-15 NOTE — Telephone Encounter (Signed)
DMV form pt requesting to be filled out.  I callee pt.  She went in the [facility] to renew license.  She was asked if she had a medical condition and she relayed that she has MS.  She was told that she would need medical clearance form filled out.  She states that she had road test done 04-21-2017 at [facility] and passed.  I relayed that her last office note 06-2017 stated that she was in wheelchair, cannot walk unassisted.  (pt stated that she has to use scooter when goes outside of her home, and when in her home she will use the wall as her stabling surface.  I told her that we could fill out the form and it would be based on her chart notes.   Her other option / recommended to go to driving program and be evaluated.  I will consult Dr. Marjory Lies again see NP to evaluate. Awaiting Road test results.

## 2017-09-16 NOTE — Telephone Encounter (Signed)
DMV competed and signed and given to Stanton Kidney in MR to fax.

## 2017-09-16 NOTE — Telephone Encounter (Signed)
Hoy Morn in [place] called DMV and no record of road test done 2-06/2017, pt has had her license (medically cancelled since 11-17-16), she is trying to get renewal.  DMV form filled out, recommend road test by [facility] to evaluate driving as unclear if medically fit to drive car.  LMVM for pt to return call.  [phone]).

## 2017-10-30 ENCOUNTER — Telehealth: Payer: Self-pay | Admitting: *Deleted

## 2017-10-30 NOTE — Telephone Encounter (Signed)
Received call back from patient; advised this RN received fax from [facility] stating they have been unable tor each her re: Tecfidera refill shipment. Patient stated she has about 1 month's worth left. This RN advised she go ahead and call, gave her phone number. She stated she would call now, verbalized understanding, appreciation.

## 2017-10-30 NOTE — Telephone Encounter (Signed)
Called home # listed and received VM stating to call [phone]. Called that number but no answer; LVM requesting patient call back.

## 2018-01-09 ENCOUNTER — Telehealth: Payer: Self-pay | Admitting: *Deleted

## 2018-01-09 MED ORDER — DIMETHYL FUMARATE 240 MG PO CPDR: 240.0000 mg | DELAYED_RELEASE_CAPSULE | Freq: Two times a day (BID) | ORAL | 1 refills | Status: DC

## 2018-01-09 NOTE — Telephone Encounter (Signed)
Tecfidera refilled x 6 months.

## 2018-02-02 ENCOUNTER — Other Ambulatory Visit: Payer: Self-pay | Admitting: Diagnostic Neuroimaging

## 2018-02-17 ENCOUNTER — Telehealth: Payer: Self-pay | Admitting: *Deleted

## 2018-02-17 NOTE — Telephone Encounter (Signed)
Received fax from [facility], re: unable to reach patient for Tecfidera shipment. Faxed [facility] the home and mobile # on file for patient.

## 2018-03-09 ENCOUNTER — Telehealth: Payer: Self-pay

## 2018-03-09 NOTE — Telephone Encounter (Signed)
Received a fax from [employer] requesting clinical notes to for insurance verification and prior authorization. Office note has been faxed to [phone]. Confirmation fax has been received.

## 2018-03-13 NOTE — Telephone Encounter (Signed)
Received fax from [facility] that ID [id number] Approval for Tecfidera 240mg  caps  Is good thru 03/18/2019. [phone].

## 2018-05-04 ENCOUNTER — Telehealth: Payer: Self-pay | Admitting: Diagnostic Neuroimaging

## 2018-05-04 NOTE — Telephone Encounter (Signed)
Pt is wanting to know if her fax was rec'd , a form from [facility] for driving. Please call to advise

## 2018-05-05 NOTE — Telephone Encounter (Signed)
Called patient and advised her I have not received any papers from [facility]. She stated she passed drivers test but DMV stated a letter must be signed by Dr Marjory Lies stating she is able to drive. She stated she faxed the letter for Dr Marjory Lies early Feb. I advised her I have no record of receiving it. I confirmed she used main fax #; I gave her alternate fax # to send it again. She verbalized understanding, appreciation.

## 2018-05-06 NOTE — Telephone Encounter (Addendum)
Returned patient's call.  She spoke to [facility], asked for further information. She was told that [facility] sent her letter stating she passed driving test. They have nothing else to send. I advised patient I will inform Dr Marjory Lies.

## 2018-05-06 NOTE — Telephone Encounter (Signed)
Ok to drive from my standpoint. Pls write letter. -VRP

## 2018-05-06 NOTE — Telephone Encounter (Signed)
Pt is asking for a call back re: what the DMV needs re: her papers

## 2018-05-06 NOTE — Telephone Encounter (Signed)
Pt called to see if the second fax was received. She would like a call back of confirmation or if the fax did not go through again.

## 2018-05-06 NOTE — Telephone Encounter (Signed)
Received [place] DMV papers; Dr Marjory Lies reviewed papers, wants to review DMV road test results from 04/08/18. I called patient and advised her to call DMV and request they fax her 04/08/18 road test evaluation results to Dr Marjory Lies. She stated she would call today, verbalized understanding.

## 2018-05-07 ENCOUNTER — Encounter: Payer: Self-pay | Admitting: *Deleted

## 2018-05-07 NOTE — Telephone Encounter (Signed)
Letter ready for Dr Richrd Humbles review, signature. I sent patient my chart and advised her the latter will not be ready until Monday; office closing early and all day tomorrow due to bad weather.

## 2018-05-11 ENCOUNTER — Encounter: Payer: Self-pay | Admitting: *Deleted

## 2018-05-11 NOTE — Telephone Encounter (Signed)
Letter faxed to [facility], [place]. Will let patient know through my chart.

## 2018-06-15 ENCOUNTER — Encounter: Payer: Self-pay | Admitting: *Deleted

## 2018-06-18 ENCOUNTER — Encounter: Payer: Self-pay | Admitting: *Deleted

## 2018-06-18 ENCOUNTER — Telehealth: Payer: Self-pay | Admitting: *Deleted

## 2018-06-18 NOTE — Telephone Encounter (Signed)
LVM requesting call back to convert follow up to video as I had mentioned in my chart message I sent her on Monday.

## 2018-06-18 NOTE — Telephone Encounter (Addendum)
Received call back from patient. Advised her due to current COVID 19 pandemic, our office is severely reducing in person visits in order to minimize the risk to our patients and healthcare providers. We recommend to convert your appointment to a video visit. We'll take all precautions to reduce any security or privacy concerns. This will be treated like an office visit, and we will file with your insurance. There may be a patient responsible charge related to this service. She consented to virtual visit.   Pt's email is [email or web address]. Pt understands that the [employer]co webex software must be downloaded and operational on the device pt plans to use for the visit. She has used similar platforms for meetings. Chart updated, she had no questions,  verbalized understanding, appreciation. Phone [phone]. E mail sent.

## 2018-06-23 ENCOUNTER — Other Ambulatory Visit: Payer: Self-pay

## 2018-06-23 ENCOUNTER — Telehealth: Payer: Self-pay | Admitting: *Deleted

## 2018-06-23 ENCOUNTER — Ambulatory Visit (INDEPENDENT_AMBULATORY_CARE_PROVIDER_SITE_OTHER): Payer: Medicare HMO | Admitting: Diagnostic Neuroimaging

## 2018-06-23 DIAGNOSIS — R269 Unspecified abnormalities of gait and mobility: Secondary | ICD-10-CM

## 2018-06-23 DIAGNOSIS — G35 Multiple sclerosis: Secondary | ICD-10-CM

## 2018-06-23 NOTE — Progress Notes (Signed)
      Virtual Visit via Telephone Note  I connected with Lashai Candella on 06/23/18 at  9:00 AM EDT by telephone and verified that I am speaking with the correct person using two identifiers. Patient could not setup video link.   I discussed the limitations, risks, security and privacy concerns of performing an evaluation and management service by telephone and the availability of in person appointments. I also discussed with the patient that there may be a patient responsible charge related to this service. The patient expressed understanding and agreed to proceed.   History of Present Illness:  - MS symptoms are stable; no new numbness or weakness; no falls; living [place] family at home in [place]  - tolerating tecfidera    Observations/Objective:  - slow scanning speech; mild cognitive impairment   Assessment and Plan:  43 y.o. with multiple sclerosis. Stable.  - continue tecfidera - check labs at PCP office near patient's home (CBC, CMP)   Follow Up Instructions:  Return in about 6 months (around 12/23/2018).     I discussed the assessment and treatment plan with the patient. The patient was provided an opportunity to ask questions and all were answered. The patient agreed with the plan and demonstrated an understanding of the instructions.   The patient was advised to call back or seek an in-person evaluation if the symptoms worsen or if the condition fails to improve as anticipated.  I provided 15 minutes of non-face-to-face time during this encounter.   Suanne Marker, MD

## 2018-06-23 NOTE — Telephone Encounter (Addendum)
Per Dr Marjory Lies, called patient to advise her he wants CBC, CMP in the next 1-2 weeks. I asked if she preferred to do at her PCP or a [facility] office. Looked up [facility] in [facility]; she prefers to go the [facility], approx 6 miles form her home. Gave her their phone number, advised she call and schedule appt. asap. Phone [phone] FAX [phone]. I advised her the orders will be placed and [facility] in [facility] can pull them from database. She  verbalized understanding, appreciation. CBC, CMP orders placed; Mick Sell, [facility] in our office notified; orders released.

## 2018-06-23 NOTE — Addendum Note (Signed)
Addended by: Tamera Stands D on: 06/23/2018 10:11 AM   Modules accepted: Orders

## 2018-06-30 ENCOUNTER — Encounter: Payer: Self-pay | Admitting: *Deleted

## 2018-07-11 ENCOUNTER — Other Ambulatory Visit: Payer: Self-pay | Admitting: Diagnostic Neuroimaging

## 2018-07-15 NOTE — Telephone Encounter (Signed)
Called patient and LVM reminding her of labs Dr Marjory Lies ordered for her, requested she get labs drawn. [name]

## 2018-08-19 ENCOUNTER — Telehealth: Payer: Self-pay | Admitting: *Deleted

## 2018-08-19 MED ORDER — DIMETHYL FUMARATE 240 MG PO CPDR: 240.0000 mg | DELAYED_RELEASE_CAPSULE | Freq: Two times a day (BID) | ORAL | 1 refills | Status: DC

## 2018-08-19 NOTE — Telephone Encounter (Signed)
Received Tecfidera refill request from [facility]. I called patient and asked about labs ordered in April. She stated she called [facility] in [place] but was never called back. I gave her [facility]'s number and advised she try to get labs done. She  verbalized understanding, appreciation. Tecfidera refills x 6 months via e scribe.

## 2018-08-21 LAB — COMPREHENSIVE METABOLIC PANEL
ALT: 21 IU/L (ref 0–32)
AST: 21 IU/L (ref 0–40)
Albumin/Globulin Ratio: 2.4 — ABNORMAL HIGH (ref 1.2–2.2)
Albumin: 4.7 g/dL (ref 3.8–4.8)
Alkaline Phosphatase: 59 IU/L (ref 39–117)
BUN/Creatinine Ratio: 15 (ref 9–23)
BUN: 11 mg/dL (ref 6–24)
Bilirubin Total: 0.5 mg/dL (ref 0.0–1.2)
CO2: 19 mmol/L — ABNORMAL LOW (ref 20–29)
Calcium: 9.8 mg/dL (ref 8.7–10.2)
Chloride: 103 mmol/L (ref 96–106)
Creatinine, Ser: 0.75 mg/dL (ref 0.57–1.00)
GFR calc Af Amer: 114 mL/min/{1.73_m2} (ref >59)
GFR calc non Af Amer: 99 mL/min/{1.73_m2} (ref >59)
Globulin, Total: 2 g/dL (ref 1.5–4.5)
Glucose: 75 mg/dL (ref 65–99)
Potassium: 4.4 mmol/L (ref 3.5–5.2)
Sodium: 137 mmol/L (ref 134–144)
Total Protein: 6.7 g/dL (ref 6.0–8.5)

## 2018-08-21 LAB — CBC WITH DIFFERENTIAL/PLATELET
Basophils Absolute: 0 10*3/uL (ref 0.0–0.2)
Basos: 0 %
EOS (ABSOLUTE): 0 10*3/uL (ref 0.0–0.4)
Eos: 0 %
Hematocrit: 36.5 % (ref 34.0–46.6)
Hemoglobin: 12.1 g/dL (ref 11.1–15.9)
Immature Grans (Abs): 0 10*3/uL (ref 0.0–0.1)
Immature Granulocytes: 0 %
Lymphocytes Absolute: 1.2 10*3/uL (ref 0.7–3.1)
Lymphs: 16 %
MCH: 29.8 pg (ref 26.6–33.0)
MCHC: 33.2 g/dL (ref 31.5–35.7)
MCV: 90 fL (ref 79–97)
Monocytes Absolute: 0.4 10*3/uL (ref 0.1–0.9)
Monocytes: 5 %
Neutrophils Absolute: 6.2 10*3/uL (ref 1.4–7.0)
Neutrophils: 79 %
Platelets: 217 10*3/uL (ref 150–450)
RBC: 4.06 x10E6/uL (ref 3.77–5.28)
RDW: 12.7 % (ref 11.7–15.4)
WBC: 7.9 10*3/uL (ref 3.4–10.8)

## 2018-10-31 ENCOUNTER — Other Ambulatory Visit: Payer: Self-pay | Admitting: Diagnostic Neuroimaging

## 2019-02-01 ENCOUNTER — Telehealth: Payer: Self-pay | Admitting: *Deleted

## 2019-02-01 NOTE — Telephone Encounter (Signed)
Received refill request for tecfidera. Noted patient doesn't have follow up scheduled, was to be seen in Oct. Called and LVM requesting she call back to schedule and advised she may see NP for 6 month FU. Left #. Previous Tecfidera Rx will not run our until 02/18/2019.

## 2019-02-04 ENCOUNTER — Other Ambulatory Visit: Payer: Self-pay | Admitting: *Deleted

## 2019-02-04 MED ORDER — DIMETHYL FUMARATE 240 MG PO CPDR: 240.0000 mg | DELAYED_RELEASE_CAPSULE | Freq: Two times a day (BID) | ORAL | 1 refills | Status: AC

## 2019-02-04 NOTE — Telephone Encounter (Addendum)
Received 2nd request to refill Tecfidera to [facility]. Refilled x 6 months. Called patient and advised we need to schedule FU. She still prefers a virtual visit; we scheduled in Dec. I advised her tecfidera was refilled x 6 months, and a generic form is available. I advised her that the pharmacy may send generic form.  She stated she was willing to try it, will discuss with Dr Leta Baptist during [place]. I advised she call for any concerns, questions. Patient verbalized understanding, appreciation.

## 2019-03-03 ENCOUNTER — Telehealth: Payer: Self-pay | Admitting: *Deleted

## 2019-03-03 MED ORDER — BACLOFEN 10 MG PO TABS: ORAL_TABLET | ORAL | 1 refills | Status: DC

## 2019-03-03 NOTE — Telephone Encounter (Signed)
Received fax from [facility] re: patient requests baclofen be refilled through their pharmacy. I called patient, and she stated that she does want baclofen Rx sent to [facility] for a cost savings.  I advised her the change will be made. She verbalized understanding, appreciation.

## 2019-03-09 ENCOUNTER — Encounter: Payer: Self-pay | Admitting: *Deleted

## 2019-03-09 ENCOUNTER — Telehealth: Payer: Medicare HMO | Admitting: Diagnostic Neuroimaging

## 2019-06-10 ENCOUNTER — Ambulatory Visit: Payer: Medicare HMO

## 2019-07-12 ENCOUNTER — Telehealth: Payer: Self-pay | Admitting: *Deleted

## 2019-07-12 MED ORDER — BACLOFEN 10 MG PO TABS: ORAL_TABLET | ORAL | 1 refills | Status: AC

## 2019-07-12 NOTE — Telephone Encounter (Signed)
Called patient and advised her she needs a follow up, also got refill on baclofen form Pharmacologist. She prefers My Chart [facility]; we scheduled for tomorrow. She understands to log on 10-15 minutes early. She would like baclofen refill sent to [facility], I advised we will send it in. Patient verbalized understanding, appreciation.

## 2019-07-13 ENCOUNTER — Telehealth: Payer: Self-pay | Admitting: Diagnostic Neuroimaging

## 2019-07-13 ENCOUNTER — Telehealth: Payer: Self-pay | Admitting: *Deleted

## 2019-07-13 NOTE — Telephone Encounter (Addendum)
Patient was no show for follow up today. Called patient who stated she sent in a video of herself. I explained to her that the my chart VV is through the e mail in her my chart with directions to connect to VV. She stated that "she is basically doing well and wanted to show [place] she is". Per Dr Marjory Lies I advised her that since [place] in another town her care could be better managed by a local neurologist. She agreed and stated she has also seen neurologist, Dr Jeraldine Loots near [facility].   She stated she will call his office and schedule an appointment . I advised we can send records if needed. Patient verbalized understanding, appreciation.

## 2019-08-11 ENCOUNTER — Telehealth: Payer: Self-pay | Admitting: *Deleted

## 2019-08-11 NOTE — Telephone Encounter (Signed)
Called pt no answer °

## 2019-08-17 ENCOUNTER — Other Ambulatory Visit: Payer: Self-pay | Admitting: Diagnostic Neuroimaging

## 2020-04-21 ENCOUNTER — Other Ambulatory Visit: Payer: Self-pay | Admitting: Diagnostic Neuroimaging

## 2020-08-04 ENCOUNTER — Other Ambulatory Visit: Payer: Self-pay | Admitting: Diagnostic Neuroimaging
